# Patient Record
Sex: Female | Born: 1940 | Race: White | Hispanic: No | Marital: Married | Smoking: Never smoker
Health system: Southern US, Community
[De-identification: ages and names within clinical notes are randomized; demographics above are authoritative.]

## PROBLEM LIST (undated history)

## (undated) DIAGNOSIS — I10 Essential (primary) hypertension: Secondary | ICD-10-CM

## (undated) DIAGNOSIS — E785 Hyperlipidemia, unspecified: Secondary | ICD-10-CM

## (undated) DIAGNOSIS — M1990 Unspecified osteoarthritis, unspecified site: Secondary | ICD-10-CM

## (undated) DIAGNOSIS — E119 Type 2 diabetes mellitus without complications: Secondary | ICD-10-CM

## (undated) HISTORY — DX: Essential (primary) hypertension: I10

## (undated) HISTORY — DX: Hyperlipidemia, unspecified: E78.5

## (undated) HISTORY — DX: Unspecified osteoarthritis, unspecified site: M19.90

## (undated) HISTORY — DX: Type 2 diabetes mellitus without complications: E11.9

## (undated) NOTE — Telephone Encounter (Signed)
 Formatting of this note might be different from the original. URGENT referral received from [facility]  Placed in stat orders bin by break room  Electronically signed by Andra Lapine A at 07/11/2024 10:49 AM CDT

---

## 1998-04-07 ENCOUNTER — Ambulatory Visit: Admission: RE | Admit: 1998-04-07 | Discharge: 1998-04-07 | Payer: Self-pay | Admitting: General Surgery

## 1999-09-07 ENCOUNTER — Ambulatory Visit (HOSPITAL_COMMUNITY): Admission: RE | Admit: 1999-09-07 | Discharge: 1999-09-07 | Payer: Self-pay | Admitting: Interventional Cardiology

## 1999-09-07 ENCOUNTER — Encounter: Payer: Self-pay | Admitting: Interventional Cardiology

## 1999-09-11 ENCOUNTER — Encounter: Payer: Self-pay | Admitting: Internal Medicine

## 1999-09-11 ENCOUNTER — Ambulatory Visit (HOSPITAL_COMMUNITY): Admission: RE | Admit: 1999-09-11 | Discharge: 1999-09-11 | Payer: Self-pay | Admitting: Internal Medicine

## 2000-03-11 ENCOUNTER — Encounter: Admission: RE | Admit: 2000-03-11 | Discharge: 2000-03-11 | Payer: Self-pay | Admitting: General Surgery

## 2000-03-11 ENCOUNTER — Encounter: Payer: Self-pay | Admitting: General Surgery

## 2001-03-15 ENCOUNTER — Encounter: Payer: Self-pay | Admitting: General Surgery

## 2001-03-15 ENCOUNTER — Encounter: Admission: RE | Admit: 2001-03-15 | Discharge: 2001-03-15 | Payer: Self-pay | Admitting: General Surgery

## 2002-02-27 ENCOUNTER — Encounter: Payer: Self-pay | Admitting: General Surgery

## 2002-02-27 ENCOUNTER — Encounter: Admission: RE | Admit: 2002-02-27 | Discharge: 2002-02-27 | Payer: Self-pay | Admitting: General Surgery

## 2003-03-04 ENCOUNTER — Encounter: Payer: Self-pay | Admitting: General Surgery

## 2003-03-04 ENCOUNTER — Encounter: Admission: RE | Admit: 2003-03-04 | Discharge: 2003-03-04 | Payer: Self-pay | Admitting: General Surgery

## 2003-03-18 ENCOUNTER — Encounter (INDEPENDENT_AMBULATORY_CARE_PROVIDER_SITE_OTHER): Payer: Self-pay | Admitting: Specialist

## 2003-03-18 ENCOUNTER — Ambulatory Visit (HOSPITAL_BASED_OUTPATIENT_CLINIC_OR_DEPARTMENT_OTHER): Admission: RE | Admit: 2003-03-18 | Discharge: 2003-03-18 | Payer: Self-pay | Admitting: Plastic Surgery

## 2004-03-04 ENCOUNTER — Encounter: Admission: RE | Admit: 2004-03-04 | Discharge: 2004-03-04 | Payer: Self-pay | Admitting: General Surgery

## 2005-03-11 ENCOUNTER — Encounter: Admission: RE | Admit: 2005-03-11 | Discharge: 2005-03-11 | Payer: Self-pay | Admitting: General Surgery

## 2005-12-01 ENCOUNTER — Ambulatory Visit (HOSPITAL_COMMUNITY): Admission: RE | Admit: 2005-12-01 | Discharge: 2005-12-01 | Payer: Self-pay | Admitting: Gastroenterology

## 2006-03-14 ENCOUNTER — Encounter: Admission: RE | Admit: 2006-03-14 | Discharge: 2006-03-14 | Payer: Self-pay | Admitting: General Surgery

## 2006-05-02 ENCOUNTER — Ambulatory Visit: Payer: Self-pay | Admitting: Internal Medicine

## 2006-10-10 ENCOUNTER — Ambulatory Visit: Payer: Self-pay | Admitting: Internal Medicine

## 2006-10-18 ENCOUNTER — Ambulatory Visit: Payer: Self-pay | Admitting: Internal Medicine

## 2006-11-09 ENCOUNTER — Ambulatory Visit: Payer: Self-pay | Admitting: Internal Medicine

## 2007-03-27 ENCOUNTER — Encounter: Admission: RE | Admit: 2007-03-27 | Discharge: 2007-03-27 | Payer: Self-pay | Admitting: Family Medicine

## 2007-11-28 ENCOUNTER — Encounter: Admission: RE | Admit: 2007-11-28 | Discharge: 2007-11-28 | Payer: Self-pay | Admitting: Family Medicine

## 2008-03-27 ENCOUNTER — Encounter: Admission: RE | Admit: 2008-03-27 | Discharge: 2008-03-27 | Payer: Self-pay | Admitting: Family Medicine

## 2008-04-03 DIAGNOSIS — J309 Allergic rhinitis, unspecified: Secondary | ICD-10-CM | POA: Insufficient documentation

## 2008-04-03 DIAGNOSIS — J45909 Unspecified asthma, uncomplicated: Secondary | ICD-10-CM | POA: Insufficient documentation

## 2008-04-04 ENCOUNTER — Ambulatory Visit: Payer: Self-pay | Admitting: Internal Medicine

## 2008-04-04 LAB — CONVERTED CEMR LAB
Basophils Relative: 0.4 % (ref 0.0–1.0)
Eosinophils Relative: 3.4 % (ref 0.0–5.0)
Lymphocytes Relative: 48.6 % — ABNORMAL HIGH (ref 12.0–46.0)
MCHC: 33.6 g/dL (ref 30.0–36.0)
MCV: 91.5 fL (ref 78.0–100.0)
Neutro Abs: 2.5 10*3/uL (ref 1.4–7.7)
Platelets: 214 10*3/uL (ref 150–400)
RDW: 12.6 % (ref 11.5–14.6)

## 2008-09-30 ENCOUNTER — Ambulatory Visit: Payer: Self-pay | Admitting: Internal Medicine

## 2008-11-29 ENCOUNTER — Other Ambulatory Visit: Admission: RE | Admit: 2008-11-29 | Discharge: 2008-11-29 | Payer: Self-pay | Admitting: Family Medicine

## 2009-05-06 ENCOUNTER — Encounter: Admission: RE | Admit: 2009-05-06 | Discharge: 2009-05-06 | Payer: Self-pay | Admitting: Family Medicine

## 2010-04-09 ENCOUNTER — Other Ambulatory Visit: Admission: RE | Admit: 2010-04-09 | Discharge: 2010-04-09 | Payer: Self-pay | Admitting: Family Medicine

## 2010-05-13 ENCOUNTER — Encounter: Admission: RE | Admit: 2010-05-13 | Discharge: 2010-05-13 | Payer: Self-pay | Admitting: Family Medicine

## 2010-12-18 ENCOUNTER — Ambulatory Visit: Payer: Self-pay | Admitting: Internal Medicine

## 2010-12-18 DIAGNOSIS — J328 Other chronic sinusitis: Secondary | ICD-10-CM

## 2011-01-21 NOTE — Assessment & Plan Note (Signed)
Summary: ROV/ MBW   Primary Provider/Referring Provider:  Laurann Montana  CC:  follow up visit-asthma and allergies; cough and sneezing-still having green drainage.Marland Kitchen  History of Present Illness: ASTHMA (ICD-493.90) ALLERGIC RHINITIS (ICD-477.9)  70 yoF for f/u asthma, rhinitis. 68/16/09- She returns for follow-up of her asthma and allergic rhinitis.  Singulair did not help.  Patanol eye drops do help.  She credits improvement to the use of an alternative medicine, Spirolina, which is an algae..  She has had some cough intermittently bringing up green or white sputum.  Cough has been worse in the past month.  Her eyes itching.  She is sneezing.  Ears itch some.  She denies reflux.  09/30/08- Did well on trip to [place], air quality ok. Returned 5 days ago. Now ears stopping up. Says asthma rescue inhalers never helped her. Had flu vax.  January 12, 2011- Asthma, allergic rhinitis Nurse-CC: follow up visit-asthma and allergies; cough and sneezing-still having green drainage. Travelling in [place] - got sinusitis. Saw Dr Annalee Genta who gave her 10 days and then 7 days of Levaquin in November. Still a little green from left maxillary. She has been using saline rinse. Tried a systemic "cleansing " diet. Nose runs much of the time. Coughs up thick mucus, clear. Left frontal pressure. No more fever, sweat, and no wheeze.        Asthma History    Initial Asthma Severity Rating:    Age range: 12+ years    Symptoms: 0-2 days/week    Nighttime Awakenings: 0-2/month    Interferes w/ normal activity: no limitations    SABA use (not for EIB): 0-2 days/week    Asthma Severity Assessment: Intermittent   Preventive Screening-Counseling & Management  Alcohol-Tobacco     Smoking Status: never  Current Medications (verified): 1)  Zyrtec Allergy 10 Mg  Tabs (Cetirizine Hcl) .... Take 1 Tablet By Mouth Once A Day 2)  Crestor 10 Mg  Tabs (Rosuvastatin Calcium) .... Take 1 Tablet By Mouth Once A Day 3)   Premarin 0.625 Mg  Tabs (Estrogens Conjugated) .... Take 1 Tablet By Mouth Once A Day 4)  Patanol 0.1 %  Soln (Olopatadine Hcl) .Marland Kitchen.. 1 Drop Each Eye Two Times A Day As Needed  Allergies (verified): No Known Drug Allergies  Past History:  Past Medical History: Last updated: 04/03/2008 Allergic Rhinitis Asthma  Family History: Last updated: 01-12-2011 Father- died MI age 75 Mother- died pneumonia age 68  Social History: Last updated: January 12, 2011 Patient never smoked.  Worked [employer] as Scientist, clinical (histocompatibility and immunogenetics)- Retired Married Daughter is an Designer, industrial/product at [employer].   Risk Factors: Smoking Status: never (01-12-2011)  Family History: Father- died MI age 45 Mother- died pneumonia age 2  Social History: Patient never smoked.  Worked [employer] as Scientist, clinical (histocompatibility and immunogenetics)- Retired Married Daughter is an Designer, industrial/product at [employer].   Review of Systems      See HPI       The patient complains of productive cough, headaches, nasal congestion/difficulty breathing through nose, and change in color of mucus.  The patient denies shortness of breath with activity, shortness of breath at rest, coughing up blood, chest pain, irregular heartbeats, acid heartburn, indigestion, loss of appetite, weight change, abdominal pain, difficulty swallowing, sore throat, tooth/dental problems, sneezing, and fever.    Vital Signs:  Patient profile:   70 year old female Weight:      240 pounds O2 Sat:      98 % on Room air Pulse rate:   89 / minute  BP sitting:   132 / 82  (left arm) Cuff size:   large  Vitals Entered By: Reynaldo Minium CMA (December 18, 2010 9:03 AM)  O2 Flow:  Room air CC: follow up visit-asthma and allergies; cough and sneezing-still having green drainage.   Physical Exam  Additional Exam:  General: A/Ox3; pleasant and cooperative, NAD, SKIN: no rash, lesions NODES: no lymphadenopathy HEENT: [place]/AT, EOM- WNL, Conjuctivae- clear, PERRLA, TM- left TM retracted but no erythema, discharge or fluid  seen., Nose- yellow mucus in left nare, Throat- clear and wnl, . M III NECK: Supple w/ fair ROM, JVD- none, normal carotid impulses w/o bruits Thyroid- normal to palpation CHEST: Clear to P&A- maybe a little raspy left upper anterior chest HEART: RRR, no m/g/r heard ABDOMEN: overweight ZOX:WRUE, nl pulses, no edema  NEURO: Grossly intact to observation      Impression & Recommendations:  Problem # 1:  ALLERGIC RHINITIS (ICD-477.9)  Persistent rhinosinusitis. She is bothered by the watery nose and cough and general malaise. We discussed options and will give sample Astepro nasal spray, Rx augmentin. If she fails to improve after this, then she should have cxr and CT sinuses.  Her updated medication list for this problem includes:    Zyrtec Allergy 10 Mg Tabs (Cetirizine hcl) .Marland Kitchen... Take 1 tablet by mouth once a day  Problem # 2:  ASTHMA (ICD-493.90) Insignificant now. We discussed tendency for persistent upper respiratory inflammation to affect lower airway reactivity.  Medications Added to Medication List This Visit: 1)  Augmentin 875-125 Mg Tabs (Amoxicillin-pot clavulanate) .Marland Kitchen.. 1 two times a day  Other Orders: Est. Patient Level IV (45409)  Patient Instructions: 1)  Please schedule a follow-up appointment in 1 year. 2)  Call for early follow-up if you don't improve over the next week or two. 3)  Script augmentin 4)  sample Astepro nasal antihistamine  5)        1-2 puffs eachj nostril up to twice daily as needed 6)  continue saline rinse of your nsal passages 7)  consider trying Sudafed and/ or Mucinex.  Prescriptions: AUGMENTIN 875-125 MG TABS (AMOXICILLIN-POT CLAVULANATE) 1 two times a day  #14 x 1   Entered and Authorized by:   Waymon Budge MD   Signed by:   Waymon Budge MD on 12/18/2010   Method used:   Print then Give to Patient   RxID:   [id number]

## 2011-04-06 ENCOUNTER — Other Ambulatory Visit: Payer: Self-pay | Admitting: Family Medicine

## 2011-04-06 DIAGNOSIS — Z1231 Encounter for screening mammogram for malignant neoplasm of breast: Secondary | ICD-10-CM

## 2011-05-07 NOTE — Assessment & Plan Note (Signed)
[facility]                               PULMONARY OFFICE NOTE   COLBIE, SLIKER                   MRN:          [id number]  DATE:11/09/2006                            DOB:          02-07-1941    PROBLEMS:  1. Allergic rhinitis.  2. Asthma.   HISTORY:  After a difficult fall earlier, she says she is finally well,  except that she still tires a little easily on stairs.  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   MEDICATIONS:  Zyrtec, Premarin, glucosamine, Crestor, Mucinex DM, Endal HD.   ALLERGIES:  No medication allergy.   OBJECTIVE:  Weight 250 pounds, pulse regular 74, room air saturation 98%.  There is scarring of the left tympanic membrane without obvious fluid or  inflammation in the canal.  The right side is clear.  Nasal pharynx is  clear.  LUNGS:  Clear to P&A.  Heart sounds regular without murmur.  She looks comfortable, there is no adenopathy or stridor, no postnasal drip.   IMPRESSION:  1. Left tympanic membrane scarring from a previous otitis.  2. Allergic rhinitis and asthma/asthmatic bronchitis, currently stable. We      will want to look again at maintenance therapies for her.  She wishes      to return in April, based on her general seasonal pattern.  I want to      look at her allergy control status.     Clinton D. Maple Hudson, MD, [facility], FACP  Electronically Signed    CDY/MedQ  DD: 11/09/2006  DT: 11/09/2006  Job #: [id number]   cc:   Tally Joe, M.D.

## 2011-05-07 NOTE — Op Note (Signed)
NAME:  Janice Mcdonald, Janice Mcdonald                      ACCOUNT NO.:  [id number]   MEDICAL RECORD NO.:  [id number]                   PATIENT TYPE:  AMB   LOCATION:  DSC                                  FACILITY:  [facility]   PHYSICIAN:  Etter Sjogren, M.D.                  DATE OF BIRTH:  1941-03-18   DATE OF PROCEDURE:  03/18/2003  DATE OF DISCHARGE:                                 OPERATIVE REPORT   PREOPERATIVE DIAGNOSES:  1. Soft tissue mass, forehead, recurrent, undetermined behavior, greater     than 1.0 cm.  2. Lesion of left cheek, undetermined behavior, greater than 0.5 cm.  3. Lesion x2 of the nose, one nasal sidewall, one on the ala, less than 0.5     cm, undetermined behavior.   POSTOPERATIVE DIAGNOSES:  1. Soft tissue mass, forehead, recurrent, undetermined behavior, greater     than 1.0 cm.  2. Lesion of left cheek, undetermined behavior, greater than 0.5 cm.  3. Lesion x2 of the nose, one nasal sidewall, one on the ala, less than 0.5     cm, undetermined behavior.  4. Complicated open wound of the forehead, 2.0 cm, through all layers.   PROCEDURES:  1. Complex wound closure of forehead, greater than 2.0 cm.  2. Excision of lesion of undetermined behavior, left cheek, greater than 0.5     cm.  3. Excision of lesion, right nasal sidewall, less than 0.5 cm.  4. Excision of lesion, right ala of the nose, less than 0.5 cm.  5. Excision of soft tissue mass of undetermined behavior, submuscular, of     the forehead, greater than 1.0 cm.   SURGEON:  Etter Sjogren, M.D.   ANESTHESIA:  1% Xylocaine with epinephrine plus bicarbonate.   CLINICAL NOTE:  A 69 year old woman who has a number of lesions.  One of  them on the forehead is a soft tissue mass that had recurred, and it is  medically necessary to excise it.  This is a deep tumor extending below the  muscle.  She also has several lesions, left cheek and two on the nose, that  have enlarged and are elevated and warrant excision  as well.  The nature of  the procedure and the risks and the possibility of scarring and possible  need for surgery depending upon the final pathology report were discussed  with her, and she wished to proceed.   DESCRIPTION OF PROCEDURE:  The patient was placed supine.  She was prepped  with Betadine after marking the elliptical excisions.  She was draped with  sterile drapes.  Satisfactory local anesthesia was achieved.  The forehead  was addressed first, excising this lesion with an ellipse of overlying skin,  then carrying the dissection down deep all the way down to the periosteum.  The lesion was excised in its entirety.  The wound cleansed thoroughly.  There was no deep connection to the  underlying bone.  Thorough irrigation  and hemostasis having been confirmed, a layered closure of 5-0 Monocryl  interrupted, inverted deep sutures for the muscle, 5-0 Monocryl interrupted,  inverted subcutaneous, and 6-0 Prolene simple running suture.   Lesions of left cheek, right nasal sidewall, and right ala were excised with  elliptical excisions, irrigated thoroughly, and closed.  The left cheek  wound did require one deep Monocryl suture, inverted, and then the remainder  were 6-0  Prolene simple running sutures and interrupted sutures as needed.  Antibiotic ointment and sterile dressings applied.  Tolerated it well.   DISPOSITION:  Will see her back in the office in a week for recheck.                                               Etter Sjogren, M.D.    DB/MEDQ  D:  03/18/2003  T:  03/18/2003  Job:  [id number]

## 2011-05-07 NOTE — Op Note (Signed)
NAMECHIYEKO, FERRE NO.:  [id number]   MEDICAL RECORD NO.:  [id number]          PATIENT TYPE:  AMB   LOCATION:  ENDO                         FACILITY:  [facility]   PHYSICIAN:  Bernette Redbird, M.D.   DATE OF BIRTH:  1941-05-05   DATE OF PROCEDURE:  12/01/2005  DATE OF DISCHARGE:                                 OPERATIVE REPORT   PROCEDURE:  Colonoscopy.   ENDOSCOPIST:  Bernette Redbird, M.D.   INDICATIONS:  Initial colon cancer screening exam in an asymptomatic 70-year-  old female without worrisome risk factors.   FINDINGS:  Sigmoid diverticulosis and fixation but no polyps or cancer seen.   DESCRIPTION OF PROCEDURE:  The nature, purpose and risks of the procedure  had been briefly reviewed with the patient who had come to [place] through our  open access program and thus had already been introduced to the procedure  through the office.  She provided written consent prior to my discussion.   Sedation was fentanyl 100 mcg and Versed 5 mg IV without arrhythmias or  desaturation.   The Olympus adjustable tension pediatric video colonoscope was used for this  procedure and was advanced with moderate difficulty through a very fixated  and tightly angulated sigmoid region but then quite easily the remainder of  the way around the colon to the cecum, using some external abdominal  pressure to enter the base of the cecum which was identified by  visualization of appendiceal orifice.  Pullback was then performed.  The  quality of prep was excellent, and it was felt that all areas were well  seen.   In the sigmoid region was a moderate amount of diverticulosis but apart from  that in the associated fixation in that area, this was a normal examination.  No polyps, cancer, colitis or vascular ectasia were observed, and  retroflexion the rectum and reinspection of the rectum were normal.  No  biopsies were obtained.  The patient tolerated the procedure well, and there  no  apparent complications.   IMPRESSION:  :  Screening colonoscopy in a standard risk individual, without worrisome  findings (V7 6.51)  1.  Sigmoid diverticulosis and fixation.   PLAN:  Followup colon exam in 10 years, either colonoscopy or consideration  for virtual colonoscopy considering the technical tissues in this patient.           ______________________________  Bernette Redbird, M.D.     RB/MEDQ  D:  12/01/2005  T:  12/02/2005  Job:  [id number]   cc:   Tally Joe, M.D.  Fax: [phone]

## 2011-05-07 NOTE — Assessment & Plan Note (Signed)
 [facility]                               PULMONARY OFFICE NOTE   KAIDENCE, SANT                   MRN:          [id number]  DATE:10/18/2006                            DOB:          April 15, 1941    PROBLEM:  1. Allergic rhinitis.  2. Asthma.   HISTORY:  Increased head and chest congestion with cough and low-grade fever  began sometime in September.  She took a Z-Pack.  It got worse.  She saw the  nurse practitioner in October, was given [facility] and seemed to get better  for a while.  She has left sinus congestion now, and her ears are more open.  The fever began again 3 day ago, and she is coughing green sputum.  She does  not describe hard chills or sweats, more of a general sense of malaise.   MEDICATIONS:  1. Zyrtec.  2. Premarin.  3. Glucosamine.  4. Vitamins.  5. Crestor.  6. Mucinex DM.  7. Endal HD.   ALLERGIES:  No medication allergy.   OBJECTIVE:  VITAL SIGNS:  Weight 250 pounds.  BP 132/76, pulse regular 78,  room air saturation 98%.  There is some bubbles behind the left tympanic  membrane.  She is hoarse.  Speech quality is nasal.  There is a light  congestive cough.  Heart sounds are normal.   IMPRESSION:  1. Left serous otitis.  2. Rhinosinusitis.  3. Acute bronchitis.   PLAN:  1. Nasal nebulizer treatment with Neo-Synephrine.  2. Depo-Medrol 80 mg IM with steroid talk .  3. Avelox 400 mg daily for 10 days.  4. Fluids.  5. Keep scheduled appointment, earlier p.r.n. and start treatments.     Clinton D. Maple Hudson, MD, [facility], FACP  Electronically Signed    CDY/MedQ  DD: 10/20/2006  DT: 10/21/2006  Job #: [id number]   cc:   Tally Joe, M.D.

## 2011-05-07 NOTE — Assessment & Plan Note (Signed)
[facility]                               PULMONARY OFFICE NOTE   ANMARIE, FUKUSHIMA                   MRN:          [id number]  DATE:10/10/2006                            DOB:          [birth year]    HISTORY OF PRESENT ILLNESS:  The patient is a 70 year old white female  patient of Dr. Roxy Cedar who has a known history of allergic rhinitis and  asthma who presents with a 1-week history of increased cough, congestion,  fever, chills, sweats and sinus pain and pressure.  The patient denies any  hemoptysis, chest pain, orthopnea, PND or leg swelling.   PAST MEDICAL HISTORY:  Reviewed.   CURRENT MEDICATIONS:  Reviewed.   PHYSICAL EXAMINATION:  GENERAL:  The patient is a pleasant female in no  acute distress.  VITAL SIGNS:  She is afebrile with stable vital signs.  O2 saturation is 98%  on room air.  HEENT:  Nasal mucosa is with some mild erythema.  __________.  Left TM and  EAC are clear.  Right TM is erythematous and bulging.  NECK:  Supple without adenopathy.  LUNGS:  Lung sounds are clear.  CARDIAC:  Regular rate.  ABDOMEN:  Soft and benign.  EXTREMITIES:  Warm without edema.   IMPRESSION AND PLAN:  Acute __________ infection and right otitis  medication.  The patient is to begin Omnicef x7 days, Mucinex DM twice  daily, Zyrtec 10 mg daily over the next 5 days.  May use Inderal HC as  needed for cough.  The patient is to return if no improvement or symptoms  worsen.      ______________________________  Rubye Oaks, NP    ______________________________  Rennis Chris. Maple Hudson, MD, [facility], FACP     TP/MedQ  DD:  10/11/2006  DT:  10/12/2006  Job #:  [id number]

## 2011-05-13 ENCOUNTER — Encounter: Payer: Self-pay | Admitting: Internal Medicine

## 2011-05-13 ENCOUNTER — Ambulatory Visit (INDEPENDENT_AMBULATORY_CARE_PROVIDER_SITE_OTHER): Payer: Medicare Other | Admitting: Internal Medicine

## 2011-05-13 VITALS — BP 124/74 | HR 72 | Ht 68.0 in | Wt 236.0 lb

## 2011-05-13 DIAGNOSIS — J45909 Unspecified asthma, uncomplicated: Secondary | ICD-10-CM

## 2011-05-13 MED ORDER — AMOXICILLIN-POT CLAVULANATE 875-125 MG PO TABS: 1.0000 | ORAL_TABLET | Freq: Two times a day (BID) | ORAL | Status: DC

## 2011-05-13 MED ORDER — HYDROCODONE-HOMATROPINE 5-1.5 MG/5ML PO SYRP: 5.0000 mL | ORAL_SOLUTION | Freq: Four times a day (QID) | ORAL | Status: AC | PRN

## 2011-05-13 NOTE — Progress Notes (Signed)
  Subjective:    Patient ID: Janice Mcdonald, female    DOB: 1941-07-01, 70 y.o.   MRN: [id number]  HPI 05/13/11- 24 yo never smoker followed for asthma, allergic rhinitis, chronic rhinosinusitis. Last here December 18, 2010- treated with levaquin then augmentin for sinusitis then and finally cleared.  No starting with a cold 5 weeks ago she has had persistent cough with green. No relief from mucinex, robitussin. Denies sustained fever or syspnea. Cough till got vitreous tear and back hurts. No wheeze.  Review of Systems Constitutional:   No weight loss, night sweats,  Fevers, chills, fatigue, lassitude. HEENT:   No headaches,  Difficulty swallowing,  Tooth/dental problems,  Sore throat,                No sneezing, itching, ear ache, nasal congestion, post nasal drip,   CV:  No chest pain,  Orthopnea, PND, swelling in lower extremities, anasarca, dizziness, palpitations  GI  No heartburn, indigestion, abdominal pain, nausea, vomiting, diarrhea, change in bowel habits, loss of appetite  Resp: No shortness of breath with exertion or at rest. ,  No coughing up of blood.  .  No wheezing.   Skin: no rash or lesions.  GU: no dysuria, change in color of urine, no urgency or frequency.  No flank pain.  MS:  No joint pain or swelling.  No decreased range of motion.  No back pain.  Psych:  No change in mood or affect. No depression or anxiety.  No memory loss.      Objective:   Physical Exam General- Alert, Oriented, Affect-appropriate, Distress- none acute  Skin- rash-none, lesions- none, excoriation- none  Lymphadenopathy- none  Head- atraumatic  Eyes- Gross vision intact, PERRLA, conjunctivae clear secretions  Ears- Hearing, canals, Tm- normal  Nose- Clear, No-  Septal dev, mucus, polyps, erosion, perforation   Throat- Mallampati II , mucosa clear , drainage- none, tonsils- atrophic  Neck- flexible , trachea midline, no stridor , thyroid nl, carotid no bruit  Chest -  symmetrical excursion , unlabored     Heart/CV- RRR , no murmur , no gallop  , no rub, nl s1 s2                     - JVD- none , edema- none, stasis changes- none, varices- none     Lung- clear to P&A, wheeze- none, cough- none , dullness-none, rub- none     No rhonchi and no cough with deep breath     Chest wall-   Abd- tender-no, distended-no, bowel sounds-present, HSM- no  Br/ Gen/ Rectal- Not done, not indicated  Extrem- cyanosis- none, clubbing, none, atrophy- none, strength- nl  Neuro- grossly intact to observation        Assessment & Plan:

## 2011-05-13 NOTE — Patient Instructions (Signed)
Scripts for antibiotic and cough syrup  Fluids and cough drops are fine as needed

## 2011-05-13 NOTE — Assessment & Plan Note (Signed)
Asthmatic bronchitis, with symptoms more dramatic than exam findings at this visit. We considered neb, but will see if sustained antibiotic and cough syrup will be sufficient.

## 2011-05-18 ENCOUNTER — Ambulatory Visit
Admission: RE | Admit: 2011-05-18 | Discharge: 2011-05-18 | Disposition: A | Discharge status: Home / Self Care | Payer: Medicare Other | Source: Ambulatory Visit | Attending: Family Medicine | Admitting: Family Medicine

## 2011-05-18 DIAGNOSIS — Z1231 Encounter for screening mammogram for malignant neoplasm of breast: Secondary | ICD-10-CM

## 2011-11-23 ENCOUNTER — Encounter: Payer: Self-pay | Admitting: Internal Medicine

## 2011-11-23 ENCOUNTER — Ambulatory Visit (INDEPENDENT_AMBULATORY_CARE_PROVIDER_SITE_OTHER): Payer: Medicare Other | Admitting: Internal Medicine

## 2011-11-23 VITALS — BP 126/88 | HR 90 | Ht 68.0 in | Wt 245.0 lb

## 2011-11-23 DIAGNOSIS — J45909 Unspecified asthma, uncomplicated: Secondary | ICD-10-CM

## 2011-11-23 DIAGNOSIS — J309 Allergic rhinitis, unspecified: Secondary | ICD-10-CM

## 2011-11-23 NOTE — Progress Notes (Signed)
Patient ID: Janice Mcdonald, female    DOB: 1941-03-30, 70 y.o.   MRN: [id number]  HPI 05/13/11- 70 yo never smoker followed for asthma, allergic rhinitis, chronic rhinosinusitis. Last here December 18, 2010- treated with levaquin then augmentin for sinusitis then and finally cleared.  No starting with a cold 5 weeks ago she has had persistent cough with green. No relief from mucinex, robitussin. Denies sustained fever or syspnea. Cough till got vitreous tear and back hurts. No wheeze.  11/23/11- 72 yo never smoker followed for asthma, allergic rhinitis, chronic rhinosinusitis. Had flu vaccine. 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 Every day she has a watery rhinorrhea with mild cough triggered by odors and smoke. She feels she is doing very much better since she began taking an herbal supplement Spirulina.  Review of Systems Constitutional:   No-   weight loss, night sweats, fevers, chills, fatigue, lassitude. HEENT:   No-  headaches, difficulty swallowing, tooth/dental problems, sore throat,       No-  sneezing, itching, ear ache, nasal congestion, +post nasal drip,  CV:  No-   chest pain, orthopnea, PND, swelling in lower extremities, anasarca,                                  dizziness, palpitations Resp: No-   shortness of breath with exertion or at rest.              No-   productive cough,  Minor non-productive cough,  No- coughing up of blood.              No-   change in color of mucus.  No- wheezing.   Skin: No-   rash or lesions. GI:  No-   heartburn, indigestion, abdominal pain, nausea, vomiting, diarrhea,                 change in bowel habits, loss of appetite GU: No-   dysuria, change in color of urine, no urgency or frequency.  No- flank pain. MS:  No-   joint pain or swelling.  No- decreased range of motion.  No- back pain. Neuro-     nothing unusual Psych:  No- change in mood or affect. No depression or anxiety.  No memory loss.         Objective:   Physical Exam General- Alert, Oriented, Affect-appropriate, Distress- none acute, overweight Skin- rash-none, lesions- none, excoriation- none Lymphadenopathy- none Head- atraumatic            Eyes- Gross vision intact, PERRLA, conjunctivae clear secretions            Ears- Hearing, canals-normal            Nose- Clear, no-Septal dev, mucus, polyps, erosion, perforation             Throat- Mallampati III, mucosa clear , drainage- none, tonsils- atrophic Neck- flexible , trachea midline, no stridor , thyroid nl, carotid no bruit Chest - symmetrical excursion , unlabored           Heart/CV- RRR , no murmur , no gallop  , no rub, nl s1 s2                           - JVD- none , edema- none, stasis changes- none, varices- none  Lung- clear to P&A, wheeze- none, cough- none , dullness-none, rub- none           Chest wall-  Abd- tender-no, distended-no, bowel sounds-present, HSM- no Br/ Gen/ Rectal- Not done, not indicated Extrem- cyanosis- none, clubbing, none, atrophy- none, strength- nl Neuro- grossly intact to observation

## 2011-11-23 NOTE — Patient Instructions (Signed)
I am glad you are doing well. Please call as needed.

## 2011-11-26 NOTE — Assessment & Plan Note (Signed)
Insignificant symptoms over the last year.

## 2011-11-26 NOTE — Assessment & Plan Note (Signed)
She is satisfied with her control over time. She is particularly pleased by what she thinks is response to her herbal supplements. I am suggesting that she come back as needed.

## 2012-04-14 ENCOUNTER — Other Ambulatory Visit: Payer: Self-pay | Admitting: Family Medicine

## 2012-04-14 DIAGNOSIS — Z1231 Encounter for screening mammogram for malignant neoplasm of breast: Secondary | ICD-10-CM

## 2012-05-29 ENCOUNTER — Ambulatory Visit
Admission: RE | Admit: 2012-05-29 | Discharge: 2012-05-29 | Disposition: A | Discharge status: Home / Self Care | Payer: Medicare Other | Source: Ambulatory Visit | Attending: Family Medicine | Admitting: Family Medicine

## 2012-05-29 DIAGNOSIS — Z1231 Encounter for screening mammogram for malignant neoplasm of breast: Secondary | ICD-10-CM

## 2012-11-01 ENCOUNTER — Telehealth: Payer: Self-pay | Admitting: Internal Medicine

## 2012-11-01 NOTE — Telephone Encounter (Signed)
ATC, line busy, WCB.  

## 2012-11-02 MED ORDER — AMOXICILLIN-POT CLAVULANATE 875-125 MG PO TABS: 1.0000 | ORAL_TABLET | Freq: Two times a day (BID) | ORAL | Status: DC

## 2012-11-02 NOTE — Telephone Encounter (Signed)
Per CY-lets give patient Augmentin 875 mg #20 take 1 po bid no refills and lets see if this helps; if not please call for appt.

## 2012-11-02 NOTE — Telephone Encounter (Signed)
Spoke with pt She is c/o sinus pressure/HA, prod cough with moderate green sputum--onset beginning of Sept 2013 Taking mucinex and "diabetic cough syrup" and these help some Would like something called in Last ov with CDY 11/23/11 No ov pending No Known Allergies

## 2013-06-04 ENCOUNTER — Other Ambulatory Visit: Payer: Self-pay

## 2013-06-04 DIAGNOSIS — Z1231 Encounter for screening mammogram for malignant neoplasm of breast: Secondary | ICD-10-CM

## 2013-10-22 ENCOUNTER — Ambulatory Visit: Payer: Medicare Other

## 2013-10-23 ENCOUNTER — Ambulatory Visit: Payer: Medicare Other

## 2013-11-09 ENCOUNTER — Ambulatory Visit
Admission: RE | Admit: 2013-11-09 | Discharge: 2013-11-09 | Disposition: A | Discharge status: Home / Self Care | Payer: Medicare Other | Source: Ambulatory Visit

## 2013-11-09 DIAGNOSIS — Z1231 Encounter for screening mammogram for malignant neoplasm of breast: Secondary | ICD-10-CM

## 2014-10-08 ENCOUNTER — Other Ambulatory Visit: Payer: Self-pay

## 2014-10-08 DIAGNOSIS — Z1231 Encounter for screening mammogram for malignant neoplasm of breast: Secondary | ICD-10-CM

## 2014-12-02 ENCOUNTER — Ambulatory Visit
Admission: RE | Admit: 2014-12-02 | Discharge: 2014-12-02 | Disposition: A | Discharge status: Home / Self Care | Payer: Medicare Other | Source: Ambulatory Visit

## 2014-12-02 DIAGNOSIS — Z1231 Encounter for screening mammogram for malignant neoplasm of breast: Secondary | ICD-10-CM

## 2015-11-05 ENCOUNTER — Other Ambulatory Visit: Payer: Self-pay

## 2015-11-05 DIAGNOSIS — Z1231 Encounter for screening mammogram for malignant neoplasm of breast: Secondary | ICD-10-CM

## 2015-12-19 ENCOUNTER — Ambulatory Visit
Admission: RE | Admit: 2015-12-19 | Discharge: 2015-12-19 | Disposition: A | Discharge status: Home / Self Care | Payer: Medicare Other | Source: Ambulatory Visit

## 2015-12-19 DIAGNOSIS — Z1231 Encounter for screening mammogram for malignant neoplasm of breast: Secondary | ICD-10-CM

## 2015-12-26 ENCOUNTER — Encounter: Payer: Self-pay | Admitting: Cardiology

## 2016-06-02 ENCOUNTER — Encounter: Payer: Self-pay | Admitting: Cardiology

## 2016-06-02 DIAGNOSIS — I25118 Atherosclerotic heart disease of native coronary artery with other forms of angina pectoris: Secondary | ICD-10-CM | POA: Insufficient documentation

## 2016-06-02 DIAGNOSIS — E7800 Pure hypercholesterolemia, unspecified: Secondary | ICD-10-CM | POA: Insufficient documentation

## 2016-06-03 ENCOUNTER — Encounter: Payer: Self-pay | Admitting: Cardiology

## 2016-06-07 ENCOUNTER — Encounter: Payer: Self-pay | Admitting: Cardiology

## 2016-06-08 ENCOUNTER — Encounter: Payer: Self-pay | Admitting: Cardiology

## 2016-11-04 ENCOUNTER — Encounter: Payer: Self-pay | Admitting: *Deleted

## 2016-11-05 ENCOUNTER — Encounter: Payer: Self-pay | Admitting: *Deleted

## 2016-11-09 ENCOUNTER — Other Ambulatory Visit: Payer: Self-pay | Admitting: Family Medicine

## 2016-11-09 DIAGNOSIS — Z1231 Encounter for screening mammogram for malignant neoplasm of breast: Secondary | ICD-10-CM

## 2016-11-10 ENCOUNTER — Encounter: Payer: Self-pay | Admitting: Cardiology

## 2016-11-15 ENCOUNTER — Encounter: Payer: Self-pay | Admitting: Cardiology

## 2016-11-16 ENCOUNTER — Ambulatory Visit (INDEPENDENT_AMBULATORY_CARE_PROVIDER_SITE_OTHER): Payer: Medicare Other | Admitting: Cardiology

## 2016-11-16 ENCOUNTER — Encounter: Payer: Self-pay | Admitting: Cardiology

## 2016-11-16 ENCOUNTER — Encounter (INDEPENDENT_AMBULATORY_CARE_PROVIDER_SITE_OTHER): Payer: Self-pay

## 2016-11-16 VITALS — BP 146/90 | HR 74 | Ht 67.0 in | Wt 220.8 lb

## 2016-11-16 DIAGNOSIS — I2510 Atherosclerotic heart disease of native coronary artery without angina pectoris: Secondary | ICD-10-CM

## 2016-11-16 DIAGNOSIS — E7849 Other hyperlipidemia: Secondary | ICD-10-CM

## 2016-11-16 DIAGNOSIS — I2583 Coronary atherosclerosis due to lipid rich plaque: Secondary | ICD-10-CM

## 2016-11-16 DIAGNOSIS — E784 Other hyperlipidemia: Secondary | ICD-10-CM

## 2016-11-16 DIAGNOSIS — I10 Essential (primary) hypertension: Secondary | ICD-10-CM | POA: Diagnosis not present

## 2016-11-16 DIAGNOSIS — E6609 Other obesity due to excess calories: Secondary | ICD-10-CM

## 2016-11-16 MED ORDER — CARVEDILOL 12.5 MG PO TABS: 12.5000 mg | ORAL_TABLET | Freq: Two times a day (BID) | ORAL | 3 refills | Status: DC

## 2016-11-16 NOTE — Patient Instructions (Signed)
Medication Instructions:  Please increase your Carvedilol to 12.5 mg twice a day. Continue all other other medications as listed.  You have been referred to the Lipid Clinic.  Follow-Up: Follow up in 6 months with Dr. Anne FuSkains (around 1st week in May 2018).  You will receive a letter in the mail 2 months before you are due.  Please call us when you receive this letter to schedule your follow up appointment.  If you need a refill on your cardiac medications before your next appointment, please call your pharmacy.  Thank you for choosing [facility]!!

## 2016-11-16 NOTE — Progress Notes (Signed)
Cardiology Office Note    Date:  11/17/2016   ID:  Hennie, Gosa 1941/03/10, MRN [id number]  PCP:  Cala Bradford, MD  Cardiologist:   Donato Schultz, MD     History of Present Illness:  Janice Mcdonald is a 75 y.o. female here to establish care, coronary artery disease with stent placement in [name] summer of 2017. Dr. Adora Fridge did cath. Was visiting in May 2017. Thought it was GERD, Tums would help. EGD looked good. Noted with activity. Daughter. Dr. Hortense Ramal (Cards) - gave approval to go to [place]. Been to [place]. [employer]. CE credits.   Has history of diabetes, hyperlipidemia intolerant of Crestor, previously refusing other statin medications, hypertension.  Creatinine 0.8, glucose 1:30, ALT 23, TSH 0.8, total cholesterol 304 triglycerides 167, HDL 53, LDL 217, hemoglobin A1c 7.1  She is a nonsmoker. 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  In review of her prior cardiology notes from [place], she had a markedly abnormal nuclear stress test which resulted in cardiac catheterization and subsequent PCI. Her ejection fraction was 60% with hypokinesis of the anterolateral wall. She had 90% proximal narrowing of the LAD, 50% narrowing of the diagonal 2, 90% apical LAD stenosis, 70% narrowing of small obtuse marginal 2, 30% proximal RCA, 50% to 30% mid narrowing of the RCA, patent PDA with 50% narrowing of a large posterior lateral branch of the distal RCA. She had successful drug eluting stent placement to the proximal LAD with a 3.0 x 20 energy drug-eluting stent upsized distally to 3.5 x 12 noncompliant balloon and proximally with a 4.0 x 12 noncompliant balloon. Her LDL at repeat from her cardiology visit was 89. Excellent. IVF are to the RCA was not significant. Repatha.   EKG shows sinus rhythm, biphasic T-wave in V2 and  V3.    [address] [place], [place] upper [place].   Past Medical History:  Diagnosis Date  . ALLERGIC RHINITIS   . Asthma   . Diabetes mellitus without complication (HCC)   . Hyperlipidemia   . Hypertension   . Osteoarthritis     No past surgical history on file.  Current Medications: Outpatient Medications Prior to Visit  Medication Sig Dispense Refill  . amitriptyline (ELAVIL) 10 MG tablet     . aspirin 81 MG tablet Take 81 mg by mouth daily.      . calcium carbonate (OS-CAL) 600 MG TABS Take 600 mg by mouth daily.      . Cholecalciferol (VITAMIN D) 1000 UNITS capsule Take 2,000 Units by mouth daily.      . Coenzyme Q10 (COQ10) 100 MG CAPS Take 1 capsule by mouth daily.      . fluticasone (FLONASE) 50 MCG/ACT nasal spray As needed    . Multiple Vitamins-Minerals (MULTIVITAMIN PO) Take by mouth daily.    . nitroGLYCERIN (NITROSTAT) 0.4 MG SL tablet Place 0.4 mg under the tongue every 5 (five) minutes as needed for chest pain.    . ticagrelor (BRILINTA) 90 MG TABS tablet Take by mouth 2 (two) times daily.    . vitamin C (ASCORBIC ACID) 500 MG tablet Take 500 mg by mouth daily.      Marland Kitchen losartan (COZAAR) 50 MG tablet Take 1 tablet by mouth daily.    Marland Kitchen atorvastatin (LIPITOR) 10 MG tablet Take 10 mg by mouth daily.    Marland Kitchen  celecoxib (CELEBREX) 200 MG capsule Take 200 mg by mouth daily.      . ondansetron (ZOFRAN) 4 MG tablet Take 4 mg by mouth every 8 (eight) hours as needed for nausea or vomiting.     No facility-administered medications prior to visit.      Allergies:   Symmetrel [amantadine]; Crestor [rosuvastatin]; and Singulair [montelukast]   Social History   Social History  . Marital status: Married    Spouse name: N/A  . Number of children: N/A  . Years of education: N/A   Occupational History  . 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 Smoking status: Never Smoker  . Smokeless tobacco: Never Used  . Alcohol use 1.2 oz/week    2 Glasses of wine per week      Comment: DRINKS TWICE A  MONTH   . Drug use: No  . Sexual activity: Not Asked   Other Topics Concern  . None   Social History Narrative   Daughter is an Designer, industrial/productanesthesiologist at [employer]     Family History:  The patient's family history includes Heart attack in her father; Pneumonia in her mother.   ROS:   Please see the history of present illness.    ROS All other systems reviewed and are negative.   PHYSICAL EXAM:   VS:  BP (!) 146/90 (BP Location: Right Arm, Cuff Size: Normal)   Pulse 74   Ht 5\' 7"  (1.702 m)   Wt 220 lb 12.8 oz (100.2 kg)   LMP  (LMP Unknown)   BMI 34.58 kg/m    GEN: Well nourished, well developed, in no acute distress  HEENT: normal  Neck: no JVD, carotid bruits, or masses Cardiac: RRR; no murmurs, rubs, or gallops,no edema  Respiratory:  clear to auscultation bilaterally, normal work of breathing GI: soft, nontender, nondistended, + BS MS: no deformity or atrophy  Skin: warm and dry, no rash Neuro:  Alert and Oriented x 3, Strength and sensation are intact Psych: euthymic mood, full affect  Wt Readings from Last 3 Encounters:  11/16/16 220 lb 12.8 oz (100.2 kg)  11/23/11 245 lb (111.1 kg)  05/13/11 (!) 236 lb (107 kg)      Studies/Labs Reviewed:   EKG:  EKG is ordered today. 11/16/16-sinus rhythm, 74, no other abnormalities. T-wave inversions are no longer seen on EKG in V2 and V3.  Recent Labs: No results found for requested labs within last 8760 hours.   Lipid Panel No results found for: CHOL, TRIG, HDL, CHOLHDL, VLDL, LDLCALC, LDLDIRECT  Additional studies/ records that were reviewed today include:  Prior office notes reviewed, lab work reviewed, ECGs reviewed    ASSESSMENT:    1. Coronary artery disease due to lipid rich plaque   2. Familial hyperlipidemia   3. Class 1 obesity due to excess calories without serious comorbidity in adult, unspecified BMI   4. Essential hypertension      PLAN:  In order of problems listed  above:  Coronary artery disease  - Proximal LAD stent 06/07/16  - This was after abnormal nuclear stress test and exertional anginal symptoms.  - Aggressive secondary prevention. She is asymptomatic. Doing well. Enjoys travel.  - Continue dual antiplatelet therapy for one year  Familial hyperlipidemia  - She is currently taking PCSK9 Repatha. [name] giving [place] this medication  - She has been intolerant of statins.  - I will set her up with lipid clinic.  Obesity  - Encourage weight loss  Essential hypertension  - Goal blood pressure less than 130/80. At home she occasionally hits this target  - I will increase her carvedilol to 12.5 mg twice a day. She changed her carvedilol a week ago to 6.25.     Medication Adjustments/Labs and Tests Ordered: Current medicines are reviewed at length with the patient today.  Concerns regarding medicines are outlined above.  Medication changes, Labs and Tests ordered today are listed in the Patient Instructions below. Patient Instructions  Medication Instructions:  Please increase your Carvedilol to 12.5 mg twice a day. Continue all other other medications as listed.  You have been referred to the Lipid Clinic.  Follow-Up: Follow up in 6 months with Dr. Anne FuSkains (around 1st week in May 2018).  You will receive a letter in the mail 2 months before you are due.  Please call us when you receive this letter to schedule your follow up appointment.  If you need a refill on your cardiac medications before your next appointment, please call your pharmacy.  Thank you [facility] choosing [facility]!!        Signed, Donato SchultzMark Sharnelle Cappelli, MD  11/17/2016 5:06 PM    [facility] [address], [place], [place]  [address] Phone: [phone]) [phone]; Fax: [phone]) [phone]

## 2016-11-19 ENCOUNTER — Ambulatory Visit (INDEPENDENT_AMBULATORY_CARE_PROVIDER_SITE_OTHER): Payer: Medicare Other | Admitting: Pharmacist

## 2016-11-19 DIAGNOSIS — E785 Hyperlipidemia, unspecified: Secondary | ICD-10-CM | POA: Insufficient documentation

## 2016-11-19 MED ORDER — PRAVASTATIN SODIUM 20 MG PO TABS: 20.0000 mg | ORAL_TABLET | Freq: Every evening | ORAL | 11 refills | Status: DC

## 2016-11-19 NOTE — Patient Instructions (Signed)
Start taking pravastatin 20mg  once a day in the evening.  Hold your Repatha for the next 2 injections - we will plan to draw new baseline labs before submitting paperwork to your insurance company.  Call Leafy Motsinger in lipid clinic with tolerability #[phone].

## 2016-11-19 NOTE — Progress Notes (Signed)
Patient ID: Janice Mcdonald                 DOB: May 20, 1941                    MRN: [id number]     HPI: Janice DunKathleen M Freiermuth is a 75 y.o. female patient referred to lipid clinic by Dr Anne FuSkains. PMH is significant for CAD s/p PCI in 2017, angina, DM, HLD, and HTN. LVEF 60%, 90% proximal narrowing of the LAD, 50% narrowing of the diagonal 2, 90% apical LAD stenosis, 70% narrowing of small obtuse marginal 2. Pt has previously been intolerant to Crestor and refused other statin medications. She presents today for further lipid management.  Patient's previous cardiologist was giving her Repatha samples. They never tried to get her approved through her insurance. Advised her that we do not have enough samples to give pt and that we will need to get the medication approved through her insurance, which should have been done before giving patient samples. She has only tried Crestor and her LDL is currently 87 on Repatha. This will make approval difficult.  Current Medications: Repatha samples from previous cardiologist in [place] Intolerances: Crestor unknown dose - excruciating leg pain for 1.5 hours each evening Risk Factors: Proximal LAD stent in June 2017, angina, CAD with >50% stenosis in multiple vessels LDL goal: 70mg /dL  Diet: Uses a clean eating magazine to cook recipes. Soup with veggies, chicken stir fry etc. Eats a lot of veggies.  Exercise: Has a bad back which limits activity. Is joining a gym.   Family History: Father with MI.  Social History: Garment/textile technologisturse anesthetist for 40 years. Patient denies smoking, drinks wine occasionally, and denies illicit drug use.  Labs: 05/2016: TC 304, TG 167, HDL 53, LDL 217 (no therapy) LDL improved to 87 on Repatha - checked with PCP Laurann Montanaynthia White at [facility]  Past Medical History:  Diagnosis Date  . ALLERGIC RHINITIS   . Asthma   . Diabetes mellitus without complication (HCC)   . Hyperlipidemia   . Hypertension   . Osteoarthritis     Current  Outpatient Prescriptions on File Prior to Visit  Medication Sig Dispense Refill  . amitriptyline (ELAVIL) 10 MG tablet     . aspirin 81 MG tablet Take 81 mg by mouth daily.      . calcium carbonate (OS-CAL) 600 MG TABS Take 600 mg by mouth daily.      . carvedilol (COREG) 12.5 MG tablet Take 1 tablet (12.5 mg total) by mouth 2 (two) times daily. 180 tablet 3  . Cholecalciferol (VITAMIN D) 1000 UNITS capsule Take 2,000 Units by mouth daily.      . Coenzyme Q10 (COQ10) 100 MG CAPS Take 1 capsule by mouth daily.      . fluticasone (FLONASE) 50 MCG/ACT nasal spray As needed    . losartan (COZAAR) 100 MG tablet Take 1 tablet (100 mg total) by mouth daily.    . Multiple Vitamins-Minerals (MULTIVITAMIN PO) Take by mouth daily.    . nitroGLYCERIN (NITROSTAT) 0.4 MG SL tablet Place 0.4 mg under the tongue every 5 (five) minutes as needed for chest pain.    . ticagrelor (BRILINTA) 90 MG TABS tablet Take by mouth 2 (two) times daily.    . vitamin C (ASCORBIC ACID) 500 MG tablet Take 500 mg by mouth daily.       No current facility-administered medications on file prior to visit.     Allergies  Allergen  Reactions  . Symmetrel [Amantadine] Anxiety  . Crestor [Rosuvastatin] Other (See Comments)    LEG CRAMPS  . Singulair [Montelukast]     NOT EFFECTIVE    Assessment/Plan:  1. Hyperlipidemia - Patient has been on Repatha samples from her previous cardiologist in [place]. No one ever submitted paperwork for medication approval through her insurance company. This will make insurance approval difficult since her labwork now shows an LDL too low for approval, plus pt has only tried 1 statin which is not enough to get her approved. Pt is agreeable to trying pravastatin 20mg  daily. Advised her to skip her next 2 injections of Repatha. Will recheck labs in ~1 month for new baseline pending pt tolerability of pravastatin. Will submit paperwork for Repatha coverage at that time.   Megan E. Supple, PharmD,  CPP [facility] [address], [place], [place] [address] Phone: [phone](336) [phone]; Fax: [phone]) [phone] 11/19/2016 10:50 AM

## 2016-12-17 ENCOUNTER — Telehealth: Payer: Self-pay | Admitting: Pharmacist

## 2016-12-17 MED ORDER — EZETIMIBE 10 MG PO TABS: 10.0000 mg | ORAL_TABLET | Freq: Every day | ORAL | 11 refills | Status: DC

## 2016-12-17 MED ORDER — PRAVASTATIN SODIUM 40 MG PO TABS: 40.0000 mg | ORAL_TABLET | Freq: Every evening | ORAL | 11 refills | Status: DC

## 2016-12-17 NOTE — Telephone Encounter (Signed)
Pt called to report that she is tolerating pravastatin 20mg  well. She is agreeable to increasing dose to 40mg  daily. She is also agreeable to starting Zetia 10mg  2 weeks after increasing pravastatin dose. She will call clinic in 1 month with update. If tolerating, will recheck labs and submit for Repatha if LDL is still above goal.

## 2016-12-21 ENCOUNTER — Ambulatory Visit
Admission: RE | Admit: 2016-12-21 | Discharge: 2016-12-21 | Disposition: A | Discharge status: Home / Self Care | Payer: Medicare Other | Source: Ambulatory Visit | Attending: Family Medicine | Admitting: Family Medicine

## 2016-12-21 DIAGNOSIS — Z1231 Encounter for screening mammogram for malignant neoplasm of breast: Secondary | ICD-10-CM

## 2016-12-22 ENCOUNTER — Other Ambulatory Visit: Payer: Self-pay | Admitting: Family Medicine

## 2016-12-22 DIAGNOSIS — R928 Other abnormal and inconclusive findings on diagnostic imaging of breast: Secondary | ICD-10-CM

## 2016-12-29 ENCOUNTER — Ambulatory Visit
Admission: RE | Admit: 2016-12-29 | Discharge: 2016-12-29 | Disposition: A | Discharge status: Home / Self Care | Payer: Medicare Other | Source: Ambulatory Visit | Attending: Family Medicine | Admitting: Family Medicine

## 2016-12-29 DIAGNOSIS — R928 Other abnormal and inconclusive findings on diagnostic imaging of breast: Secondary | ICD-10-CM

## 2017-01-04 ENCOUNTER — Other Ambulatory Visit: Payer: Self-pay | Admitting: *Deleted

## 2017-01-04 MED ORDER — CARVEDILOL 12.5 MG PO TABS: 12.5000 mg | ORAL_TABLET | Freq: Two times a day (BID) | ORAL | 2 refills | Status: DC

## 2017-01-04 MED ORDER — TICAGRELOR 90 MG PO TABS: 90.0000 mg | ORAL_TABLET | Freq: Two times a day (BID) | ORAL | 1 refills | Status: DC

## 2017-01-04 NOTE — Telephone Encounter (Signed)
Patient called and requested a refill on brilinta but she states that Dr Anne FuSkains only wants her to take it until June.

## 2017-01-19 ENCOUNTER — Telehealth: Payer: Self-pay | Admitting: Pharmacist

## 2017-01-19 DIAGNOSIS — E785 Hyperlipidemia, unspecified: Secondary | ICD-10-CM

## 2017-01-19 NOTE — Telephone Encounter (Signed)
Pt called with tolerability update on pravastatin 40mg  daily and Zetia 10mg  daily. She is tolerating both medications well and has not had any adverse effects yet. She has been on both medications combined for 2 weeks so far. Discussed increasing pravastatin to 80mg  vs checking labs to see where pt's cholesterol is. She would like to continue current medications. Will recheck lipids in 1 month and reassess at that time.

## 2017-02-15 ENCOUNTER — Other Ambulatory Visit: Payer: Medicare Other | Admitting: *Deleted

## 2017-02-15 DIAGNOSIS — E785 Hyperlipidemia, unspecified: Secondary | ICD-10-CM

## 2017-02-15 LAB — LIPID PANEL
CHOL/HDL RATIO: 3.1 ratio (ref 0.0–4.4)
Cholesterol, Total: 157 mg/dL (ref 100–199)
HDL: 50 mg/dL (ref >39)
LDL Calculated: 75 mg/dL (ref 0–99)
TRIGLYCERIDES: 162 mg/dL — AB (ref 0–149)
VLDL Cholesterol Cal: 32 mg/dL (ref 5–40)

## 2017-02-16 ENCOUNTER — Telehealth: Payer: Self-pay | Admitting: Pharmacist

## 2017-02-16 DIAGNOSIS — E785 Hyperlipidemia, unspecified: Secondary | ICD-10-CM

## 2017-02-16 NOTE — Telephone Encounter (Signed)
Discussed lipid results with pt. LDL has improved drastically from baseline of 217 to 75 since starting Zetia 10mg  daily and titrating pravastatin up to 40mg  daily. This is actually lower than when she was only taking Repatha. She is tolerating meds well and denies myalgias. Pt has been going to the gym twice a week and has been eating well. Labs reflect pt on higher pravastatin dose for the past 1 month. Discussed increasing dose to 80mg  daily vs focusing on lifestyle to bring LDL < 70. Pt would like to continue current meds and work on increasing her activity level. Will recheck lipids in 3-4 months.

## 2017-04-19 NOTE — Progress Notes (Signed)
Cardiology Office Note    Date:  04/20/2017   ID:  Janice, Mcdonald 02-26-1941, MRN [id number]  PCP:  Cala Bradford, MD  Cardiologist:   Donato Schultz, MD     History of Present Illness:  Janice Mcdonald is a 76 y.o. female here to follow up, coronary artery disease with stent placement in [name] summer of 2017. Dr. Adora Fridge did cath. Was visiting in May 2017. Thought it was GERD, Tums would help. EGD looked good. Noted with activity. Daughter. Dr. Hortense Ramal (Cards) - gave approval to go to [place]. Been to [place]. [employer]. CE credits.   Has history of diabetes, hyperlipidemia intolerant of Crestor, previously refusing other statin medications, hypertension.  Creatinine 0.8, glucose 130, ALT 23, TSH 0.8, total cholesterol 304 triglycerides 167, HDL 53, LDL 217, hemoglobin A1c 7.1  She is a nonsmoker. 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  In review of her prior cardiology notes from [place], she had a markedly abnormal nuclear stress test which resulted in cardiac catheterization and subsequent PCI. Her ejection fraction was 60% with hypokinesis of the anterolateral wall. She had 90% proximal narrowing of the LAD, 50% narrowing of the diagonal 2, 90% apical LAD stenosis, 70% narrowing of small obtuse marginal 2, 30% proximal RCA, 50% to 30% mid narrowing of the RCA, patent PDA with 50% narrowing of a large posterior lateral branch of the distal RCA. She had successful drug eluting stent placement to the proximal LAD with a 3.0 x 20 energy drug-eluting stent upsized distally to 3.5 x 12 noncompliant balloon and proximally with a 4.0 x 12 noncompliant balloon. Her LDL at repeat from her cardiology visit was 89. Excellent. IVF are to the RCA was not significant. Repatha.   EKG shows sinus rhythm, biphasic T-wave in V2 and V3.  [name]   04/20/17  - worried about coming off Brilinta.     Past Medical History:  Diagnosis Date  . ALLERGIC RHINITIS   . Asthma   . Diabetes mellitus without complication (HCC)   . Hyperlipidemia   . Hypertension   . Osteoarthritis     No past surgical history on file.  Current Medications: Outpatient Medications Prior to Visit  Medication Sig Dispense Refill  . amitriptyline (ELAVIL) 10 MG tablet Take 10 mg by mouth at bedtime.     Marland Kitchen aspirin 81 MG tablet Take 81 mg by mouth daily.      . calcium carbonate (OS-CAL) 600 MG TABS Take 600 mg by mouth daily.      . carvedilol (COREG) 12.5 MG tablet Take 1 tablet (12.5 mg total) by mouth 2 (two) times daily. 180 tablet 2  . Cholecalciferol (VITAMIN D) 1000 UNITS capsule Take 2,000 Units by mouth daily.      . Coenzyme Q10 (COQ10) 100 MG CAPS Take 1 capsule by mouth daily.      . fluticasone (FLONASE) 50 MCG/ACT nasal spray Place 1 spray into both nostrils daily as needed for allergies. As needed    . losartan (COZAAR) 100 MG tablet Take 1 tablet (100 mg total) by mouth daily.    . Multiple Vitamins-Minerals (MULTIVITAMIN PO) Take 1 tablet by mouth daily.     . nitroGLYCERIN (NITROSTAT) 0.4 MG SL tablet Place 0.4 mg under the tongue every 5 (five) minutes as needed for  chest pain.    . ticagrelor (BRILINTA) 90 MG TABS tablet Take 1 tablet (90 mg total) by mouth 2 (two) times daily. 180 tablet 1  . vitamin C (ASCORBIC ACID) 500 MG tablet Take 500 mg by mouth daily.      Marland Kitchen ezetimibe (ZETIA) 10 MG tablet Take 1 tablet (10 mg total) by mouth daily. 30 tablet 11  . pravastatin (PRAVACHOL) 40 MG tablet Take 1 tablet (40 mg total) by mouth every evening. 30 tablet 11   No facility-administered medications prior to visit.      Allergies:   Symmetrel [amantadine]; Crestor [rosuvastatin]; and Singulair [montelukast]   Social History   Social History  . Marital status: Married    Spouse name: N/A  . Number of  children: N/A  . Years of education: N/A   Occupational History  . 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 Smoking status: Never Smoker  . Smokeless tobacco: Never Used  . Alcohol use 1.2 oz/week    2 Glasses of wine per week     Comment: DRINKS TWICE A  MONTH   . Drug use: No  . Sexual activity: Not Asked   Other Topics Concern  . None   Social History Narrative   Daughter is an Designer, industrial/product at [employer]     Family History:  The patient's family history includes Heart attack in her father; Pneumonia in her mother.   ROS:   Please see the history of present illness.    ROS Unless specified above, all other ROS negative.    PHYSICAL EXAM:   VS:  BP 122/70   Pulse 72   Ht  (1.702 m)   Wt 215 lb (97.5 kg)   LMP  (LMP Unknown)   BMI 33.67 kg/m    GEN: Well nourished, well developed, in no acute distress  HEENT: normal  Neck: no JVD, carotid bruits, or masses Cardiac: RRR; no murmurs, rubs, or gallops,no edema  Respiratory:  clear to auscultation bilaterally, normal work of breathing GI: soft, nontender, nondistended, + BS MS: no deformity or atrophy  Skin: warm and dry, no rash Neuro:  Alert and Oriented x 3, Strength and sensation are intact Psych: euthymic mood, full affect  Wt Readings from Last 3 Encounters:  04/20/17 215 lb (97.5 kg)  11/19/16 219 lb 8 oz (99.6 kg)  11/16/16 220 lb 12.8 oz (100.2 kg)      Studies/Labs Reviewed:   EKG:  EKG is ordered today. 11/16/16-sinus rhythm, 74, no other abnormalities. T-wave inversions are no longer seen on EKG in V2 and V3.  Recent Labs: No results found for requested labs within last 8760 hours.   Lipid Panel    Component Value Date/Time   CHOL 157 02/15/2017 0903   TRIG 162 (H) 02/15/2017 0903   HDL 50 02/15/2017 0903   CHOLHDL 3.1 02/15/2017 0903   LDLCALC 75 02/15/2017 0903    Additional studies/ records that were reviewed today include:  Prior office notes reviewed,  lab work reviewed, ECGs reviewed    ASSESSMENT:    1. Coronary artery disease due to lipid rich plaque   2. Familial hyperlipidemia   3. Class 1 obesity due to excess calories without serious comorbidity in adult, unspecified BMI      PLAN:  In order of problems listed above:  Coronary artery disease  - Proximal LAD stent 06/07/16, No anginal symptoms.  - This was after abnormal nuclear stress test and exertional  anginal symptoms.  - Aggressive secondary prevention. She is asymptomatic. Doing well. Enjoys travel.  - Continue dual antiplatelet therapy for one year.   - She is worried about coming off of Brilinta at one year. Discussed. Im ok with her taking the lower dose Brilinta 60 mg twice a day after that for the next 2 years.  Familial hyperlipidemia  - Zetia and Pravastatin 40 - LDL 75 from 217. Better than when she was on Repatha.   - She was taking PCSK9 Repatha. [place] in [name] giving her this medication  - She has been intolerant of statins in the past, unfortunately while she is out of town and she felt pain/cramping once again her legs and decide stop her pravastatin. I discussed with her how excellent pravastatin was working for her in the differential and cost between the 2 agents. She would like to talk once again with our lipid clinic for options.  - I will set her up with lipid clinic again. I would like her to continue her Zetia nonetheless.  Obesity  - Encourage weight loss. She saw a nutritionist when she was visiting her daughter. Excellent. Continue to exercise. Her knee was "fixed" by her son-in-law and she is doing better.   Essential hypertension  - Goal blood pressure less than 130/80. Doing much better. She is at target today.  - Previously increased carvedilol to 12.5 mg twice a day.     Medication Adjustments/Labs and Tests Ordered: Current medicines are reviewed at length with the patient today.  Concerns regarding medicines are outlined  above.  Medication changes, Labs and Tests ordered today are listed in the Patient Instructions below. Patient Instructions  Medication Instructions:  Your physician recommends that you continue on your current medications as directed. Please refer to the Current Medication list given to you today.   Please call our office in June (a year out from your stent) to discuss changing your Brilinta dose.  Labwork: None  Testing/Procedures: None  Follow-Up: You have an appointment in the LIPID CLINIC Tuesday, May 03, 2017 at 9:00 AM.  Your physician wants you to follow-up in: 6 months with Dr. Anne Fu. You will receive a reminder letter in the mail two months in advance. If you don't receive a letter, please call our office to schedule the follow-up appointment.   Any Other Special Instructions Will Be Listed Below (If Applicable).     If you need a refill on your cardiac medications before your next appointment, please call your pharmacy.      Signed, Donato Schultz, MD  04/20/2017 9:35 AM    [facility] [address] [place], [place], [name]  [address] Phone: [phone]; Fax: [phone]

## 2017-04-20 ENCOUNTER — Encounter: Payer: Self-pay | Admitting: Cardiology

## 2017-04-20 ENCOUNTER — Ambulatory Visit (INDEPENDENT_AMBULATORY_CARE_PROVIDER_SITE_OTHER): Payer: Medicare Other | Admitting: Cardiology

## 2017-04-20 VITALS — BP 122/70 | HR 72 | Ht 67.0 in | Wt 215.0 lb

## 2017-04-20 DIAGNOSIS — E6609 Other obesity due to excess calories: Secondary | ICD-10-CM

## 2017-04-20 DIAGNOSIS — I2510 Atherosclerotic heart disease of native coronary artery without angina pectoris: Secondary | ICD-10-CM

## 2017-04-20 DIAGNOSIS — E784 Other hyperlipidemia: Secondary | ICD-10-CM | POA: Diagnosis not present

## 2017-04-20 DIAGNOSIS — E66811 Obesity, class 1: Secondary | ICD-10-CM

## 2017-04-20 DIAGNOSIS — I2583 Coronary atherosclerosis due to lipid rich plaque: Secondary | ICD-10-CM

## 2017-04-20 DIAGNOSIS — E7849 Other hyperlipidemia: Secondary | ICD-10-CM

## 2017-04-20 NOTE — Patient Instructions (Signed)
Medication Instructions:  Your physician recommends that you continue on your current medications as directed. Please refer to the Current Medication list given to you today.   Please call our office in June (a year out from your stent) to discuss changing your Brilinta dose.  Labwork: None  Testing/Procedures: None  Follow-Up: You have an appointment in the LIPID CLINIC Tuesday, May 03, 2017 at 9:00 AM.  Your physician wants you to follow-up in: 6 months with Dr. Anne Fu. You will receive a reminder letter in the mail two months in advance. If you don't receive a letter, please call our office to schedule the follow-up appointment.   Any Other Special Instructions Will Be Listed Below (If Applicable).     If you need a refill on your cardiac medications before your next appointment, please call your pharmacy.

## 2017-05-03 ENCOUNTER — Ambulatory Visit (INDEPENDENT_AMBULATORY_CARE_PROVIDER_SITE_OTHER): Payer: Medicare Other | Admitting: Pharmacist

## 2017-05-03 DIAGNOSIS — E785 Hyperlipidemia, unspecified: Secondary | ICD-10-CM

## 2017-05-03 MED ORDER — PRAVASTATIN SODIUM 20 MG PO TABS: 20.0000 mg | ORAL_TABLET | Freq: Every evening | ORAL | 3 refills | Status: DC

## 2017-05-03 NOTE — Progress Notes (Signed)
Patient ID: Janice Mcdonald                 DOB: Jul 10, 1941                    MRN: [id number]     HPI: Janice Mcdonald is a 76 y.o. female patient referred to lipid clinic by Dr. Anne FuSkains. PMH is significant for CAD s/p PCI in 2017, angina, DM, HLD, and HTN. LVEF 60%, 90% proximal narrowing of the LAD, 50% narrowing of the diagonal 2, 90% apical LAD stenosis, 70% narrowing of small obtuse marginal 2. Pt has previously been intolerant to Crestor and refused other statin medications.  Patient's previous cardiologist in [place] was giving her Repatha samples. They never tried to get it approved through her insurance. After discussion with pt about the inability to continue to do that, she agreed to a trial of pravastatin 20mg . After 1 month, she was then agreeable to increasing the dose to 40mg  and adding Zetia. She tolerated the combination of both medications for ~3 weeks and her LDL improved from 217 to 75 (better than when she was on Repatha). However, while she was out of town she felt pain/cramping in her legs and stopped her pravastatin. She has been tolerating Zetia monotherapy well with no complaints.  Current Medications: Zetia 10mg  daily Intolerances: Crestor unknown dose - excruciating leg pain for 1.5 hours each evening, pravastatin 40mg  - myalgias Risk Factors: Proximal LAD stent in June 2017, angina, CAD with >50% stenosis in multiple vessels LDL goal: 70mg /dL  Diet: Uses a clean eating magazine to cook recipes. Soup with veggies, chicken stir fry etc. Eats a lot of veggies.  Exercise: Has a bad back which limits activity. Is joining a gym on occasion but travels a lot. About [place]  Family History: Father with MI.  Social History: Garment/textile technologisturse anesthetist for 40 years. Patient denies smoking, drinks wine occasionally, and denies illicit drug use. She has several members of her family in the medical profession including  child Nurse, mental healthAndrea anesthesiologist [name].  Labs: 02/15/2017: TC 157, TG 162, HDL 50, LDL 75 (on pravastatin 40mg  + Zetia 10mg  daily) 10/2016: TC 194, TG 168, HDL 60, LDL 100, non-LDL 134 (Repatha 140mg  [place] every 2 weeks) 05/2016: TC 304, TG 167, HDL 53, LDL 217 (no therapy) LDL improved to 87 on Repatha - checked with PCP Laurann [facility]thia [facility] at [facility]  Past Medical History:  Diagnosis Date  . ALLERGIC RHINITIS   . Asthma   . Diabetes mellitus without complication (HCC)   . Hyperlipidemia   . Hypertension   . Osteoarthritis     Current Outpatient Prescriptions on File Prior to Visit  Medication Sig Dispense Refill  . amitriptyline (ELAVIL) 10 MG tablet Take 10 mg by mouth at bedtime.     Marland Kitchen. aspirin 81 MG tablet Take 81 mg by mouth daily.      . calcium carbonate (OS-CAL) 600 MG TABS Take 600 mg by mouth daily.      . carvedilol (COREG) 12.5 MG tablet Take 1 tablet (12.5 mg total) by mouth 2 (two) times daily. 180 tablet 2  . Cholecalciferol (VITAMIN D) 1000 UNITS capsule Take 2,000 Units by mouth daily.      . Coenzyme Q10 (COQ10) 100 MG CAPS Take 1 capsule by mouth daily.      Marland Kitchen. ezetimibe (ZETIA) 10 MG tablet Take 1 tablet (10 mg total) by mouth  daily. 30 tablet 11  . fluticasone (FLONASE) 50 MCG/ACT nasal spray Place 1 spray into both nostrils daily as needed for allergies. As needed    . losartan (COZAAR) 100 MG tablet Take 1 tablet (100 mg total) by mouth daily.    . Multiple Vitamins-Minerals (MULTIVITAMIN PO) Take 1 tablet by mouth daily.     . nitroGLYCERIN (NITROSTAT) 0.4 MG SL tablet Place 0.4 mg under the tongue every 5 (five) minutes as needed for chest pain.    . ticagrelor (BRILINTA) 90 MG TABS tablet Take 1 tablet (90 mg total) by mouth 2 (two) times daily. 180 tablet 1  . vitamin C (ASCORBIC ACID) 500 MG tablet Take 500 mg by mouth daily.       No current facility-administered medications on file prior to visit.     Allergies  Allergen Reactions  . Symmetrel [Amantadine]  Anxiety  . Crestor [Rosuvastatin] Other (See Comments)    LEG CRAMPS  . Singulair [Montelukast]     NOT EFFECTIVE    Assessment/Plan:  1. Hyperlipidemia - Pt was very close to goal < 70mg /dL on pravastatin 40mg  + Zetia 10mg  daily. Pt discontinued pravastatin 40mg  after experiencing similar leg aches/cramps as she did while on unknown does of Crestor. Pt previously tolerated pravastatin 20mg  daily + Zetia 10mg  daily well. Pt agreed to restart pravastatin 20mg  daily. Continue Zetia 10mg  daily. Pt will be traveling mid-June through November to [place] of [place]. Pt states she will visit a doctor while she is there to get lipid panel drawn. Gave pt our office fax number so that we can receive results. Instructed the patient to call with any issues. Will follow up as needed after patient returns from travel.   Sherron Monday, PharmD Clinical Pharmacy Resident 05/03/17 8:54 AM    Megan E. Supple, PharmD, CPP [facility] [address], [place], [name] [address] Phone: [phone]; Fax: [phone]

## 2017-05-03 NOTE — Patient Instructions (Signed)
Continue taking your Zetia 10mg  daily  Restart pravastatin at a lower dose of 20mg  once a day in the evening  Have your cholesterol checked in ~September at your doctor's in [place]. Have them fax results to us at #[phone]

## 2017-05-30 ENCOUNTER — Telehealth: Payer: Self-pay | Admitting: Pharmacist

## 2017-05-30 MED ORDER — TICAGRELOR 60 MG PO TABS
60.0000 mg | ORAL_TABLET | Freq: Two times a day (BID) | ORAL | 3 refills | Status: DC
Start: 2017-05-30 — End: 2018-01-16

## 2017-05-30 MED ORDER — EZETIMIBE 10 MG PO TABS
10.0000 mg | ORAL_TABLET | Freq: Every day | ORAL | 3 refills | Status: DC
Start: 2017-05-30 — End: 2018-06-08

## 2017-05-30 NOTE — Telephone Encounter (Signed)
Pt called clinic and asked to speak to me since I have managed her cholesterol over the past year. However her question is for Dr Anne FuSkains - she is inquiring about lowering her Brilinta dose to 60mg .   Per office visit with Dr Anne FuSkains on 04/20/17, ok to decrease Brilinta to 60mg  BID after pt has completed 1 full year on 90mg  BID. Pt's stent was placed 06/07/16, advised her to continue with Brilinta 90mg  BID through 06/07/17 before decreasing to 60mg  BID. Pt states she just picked up a refill of the 90mg  tablets, advised her to finish her month supply and when she is due for a refill, to start taking the 60mg  BID.   No further questions, will forward to Dr Anne FuSkains as an [place] ChildesFYI.

## 2017-05-31 ENCOUNTER — Other Ambulatory Visit: Payer: Medicare Other

## 2017-09-06 ENCOUNTER — Telehealth: Payer: Self-pay | Admitting: Pharmacist

## 2017-09-06 NOTE — Telephone Encounter (Signed)
Pt had lipid panel checked while up in [place]. LDL has increased from 75 to 98 since reducing pravastatin dose from  to . She is tolerating the pravastatin  much better and is not willing to increase the dose again due to severe muscle aches that kept her up at night on the  dose. She is adherent with her Zetia  daily as well.   Discussed PCSK9i since pt was previously receiving Repatha samples from her cardiologist in [place]. She would like to wait and have her cholesterol checked when she sees Dr Anne Fu for follow up in December. Can reassess need for PCSK9i therapy at that time.  Will forward to Dr Anne Fu as an [place] Childes.

## 2017-11-18 ENCOUNTER — Other Ambulatory Visit: Payer: Self-pay | Admitting: Family Medicine

## 2017-11-18 DIAGNOSIS — Z1231 Encounter for screening mammogram for malignant neoplasm of breast: Secondary | ICD-10-CM

## 2017-11-29 ENCOUNTER — Ambulatory Visit: Payer: Medicare Other | Admitting: Cardiology

## 2017-12-22 ENCOUNTER — Ambulatory Visit
Admission: RE | Admit: 2017-12-22 | Discharge: 2017-12-22 | Disposition: A | Discharge status: Home / Self Care | Payer: Medicare Other | Source: Ambulatory Visit | Attending: Family Medicine | Admitting: Family Medicine

## 2017-12-22 DIAGNOSIS — Z1231 Encounter for screening mammogram for malignant neoplasm of breast: Secondary | ICD-10-CM

## 2018-01-16 ENCOUNTER — Ambulatory Visit (INDEPENDENT_AMBULATORY_CARE_PROVIDER_SITE_OTHER): Payer: Medicare Other | Admitting: Cardiology

## 2018-01-16 ENCOUNTER — Encounter: Payer: Self-pay | Admitting: Cardiology

## 2018-01-16 VITALS — BP 138/82 | HR 77 | Ht 67.0 in | Wt 225.6 lb

## 2018-01-16 DIAGNOSIS — E7849 Other hyperlipidemia: Secondary | ICD-10-CM

## 2018-01-16 DIAGNOSIS — E7800 Pure hypercholesterolemia, unspecified: Secondary | ICD-10-CM

## 2018-01-16 DIAGNOSIS — I25118 Atherosclerotic heart disease of native coronary artery with other forms of angina pectoris: Secondary | ICD-10-CM | POA: Diagnosis not present

## 2018-01-16 MED ORDER — CLOPIDOGREL BISULFATE 75 MG PO TABS: 75.0000 mg | ORAL_TABLET | Freq: Every day | ORAL | 3 refills | Status: DC

## 2018-01-16 NOTE — Progress Notes (Signed)
Cardiology Office Note    Date:  01/16/2018   ID:  Janice Mcdonald, Janice Mcdonald 15-Sep-1941, MRN [id number]  PCP:  Laurann Montana, MD  Cardiologist:   Janice Schultz, MD     History of Present Illness:  Janice Mcdonald is a 77 y.o. female here to follow up, coronary artery disease with stent placement in [name] summer of 2017. Dr. Adora Fridge did cath. Was visiting in May 2017. Thought it was GERD, Tums would help. EGD looked good. Noted with activity.  Dr. Hortense Ramal (Cards) - gave approval to go to [place]. Been to [place]. [employer]. CE credits.   Has history of diabetes, hyperlipidemia intolerant of Crestor, previously refusing other statin medications, hypertension.  Creatinine 0.8, glucose 130, ALT 23, TSH 0.8, total cholesterol 304 triglycerides 167, HDL 53, LDL 217, hemoglobin A1c 7.1  She is a nonsmoker. 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  In review of her prior cardiology notes from [place], she had a markedly abnormal nuclear stress test which resulted in cardiac catheterization and subsequent PCI. Her ejection fraction was 60% with hypokinesis of the anterolateral wall. She had 90% proximal narrowing of the LAD, 50% narrowing of the diagonal 2, 90% apical LAD stenosis, 70% narrowing of small obtuse marginal 2, 30% proximal RCA, 50% to 30% mid narrowing of the RCA, patent PDA with 50% narrowing of a large posterior lateral branch of the distal RCA. She had successful drug eluting stent placement to the proximal LAD with a 3.0 x 20 energy drug-eluting stent upsized distally to 3.5 x 12 noncompliant balloon and proximally with a 4.0 x 12 noncompliant balloon. IVUS  to the RCA was not significant.  Her LDL at repeat from her cardiology visit was 89. Excellent.  Repatha.   EKG shows sinus rhythm, biphasic T-wave in V2 and V3.  [name] upper p.   04/20/17  - worried about coming off Brilinta.   01/16/18 -No new changes, no shortness of breath, no syncope, no bleeding. Cholesterol had decreased fairly well with Zetia and pravastatin 40, but she was having leg cramps on the 40 mg dose and decreased it to 20 mg.  Lipid clinic, [employer], Pharm.D. assisting.  She had been on injectables at one point but since she had such a good response to the statin therapy, we were holding off.  Overall other than some orthopedic issues, she is doing well.  She reminded me how cold it is in the [place].  01/16/18-sinus rhythm 84 with PVC personally viewed-prior unfortunately his wife got sick on the plane flight home.   Past Medical History:  Diagnosis Date  . ALLERGIC RHINITIS   . Asthma   . Diabetes mellitus without complication (HCC)   . Hyperlipidemia   . Hypertension   . Osteoarthritis     History reviewed. No pertinent surgical history.  Current Medications: Outpatient Medications Prior to Visit  Medication Sig Dispense Refill  . amitriptyline (ELAVIL) 10 MG tablet Take 10 mg by mouth at bedtime.     . calcium carbonate (OS-CAL) 600 MG TABS Take 600 mg by mouth daily.      . carvedilol (COREG) 12.5 MG tablet Take 1 tablet (12.5 mg total) by mouth 2 (two) times daily. 180 tablet 2  . Cholecalciferol (VITAMIN D) 1000 UNITS capsule Take 2,000 Units by mouth daily.      Marland Kitchen  Coenzyme Q10 (COQ10) 100 MG CAPS Take 1 capsule by mouth daily.      . empagliflozin (JARDIANCE) 10 MG TABS tablet Take 10 mg by mouth daily.    . fluticasone (FLONASE) 50 MCG/ACT nasal spray Place 1 spray into both nostrils daily as needed for allergies. As needed    . losartan (COZAAR) 100 MG tablet Take 1 tablet (100 mg total) by mouth daily.    . Multiple Vitamins-Minerals (MULTIVITAMIN PO) Take 1 tablet by mouth daily.     . nitroGLYCERIN (NITROSTAT) 0.4 MG SL tablet Place 0.4 mg under the tongue every 5 (five) minutes as  needed for chest pain.    . vitamin C (ASCORBIC ACID) 500 MG tablet Take 500 mg by mouth daily.      Marland Kitchen aspirin 81 MG tablet Take 81 mg by mouth daily.      . ticagrelor (BRILINTA) 60 MG TABS tablet Take 1 tablet (60 mg total) by mouth 2 (two) times daily. 180 tablet 3  . ezetimibe (ZETIA) 10 MG tablet Take 1 tablet (10 mg total) by mouth daily. 90 tablet 3  . pravastatin (PRAVACHOL) 20 MG tablet Take 1 tablet (20 mg total) by mouth every evening. 90 tablet 3   No facility-administered medications prior to visit.      Allergies:   Symmetrel [amantadine]; Crestor [rosuvastatin]; and Singulair [montelukast]   Social History   Socioeconomic History  . Marital status: Married    Spouse name: None  . Number of children: None  . Years of education: None  . Highest education level: None  Social Needs  . Financial resource strain: None  . Food insecurity - worry: None  . Food insecurity - inability: None  . Transportation needs - medical: None  . Transportation needs - non-medical: None  Occupational History  . 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 Smoking status: Never Smoker  . Smokeless tobacco: Never Used  Substance and Sexual Activity  . Alcohol use: Yes    Alcohol/week: 1.2 oz    Types: 2 Glasses of wine per week    Comment: DRINKS TWICE A  MONTH   . Drug use: No  . Sexual activity: None  Other Topics Concern  . None  Social History Narrative   Daughter is an Designer, industrial/product at [employer]     Family History:  The patient's family history includes Heart attack in her father; Pneumonia in her mother.   ROS:   Please see the history of present illness.    Review of Systems  All other systems reviewed and are negative.     PHYSICAL EXAM:   VS:  BP 138/82   Pulse 77   Ht 5\' 7"  (1.702 m)   Wt 225 lb 9.6 oz (102.3 kg)   LMP  (LMP Unknown)   SpO2 97%   BMI 35.33 kg/m    GEN: Well nourished, well developed, in no acute distress, overweight HEENT:  normal  Neck: no JVD, carotid bruits, or masses Cardiac: RRR; no murmurs, rubs, or gallops,no edema  Respiratory:  clear to auscultation bilaterally, normal work of breathing GI: soft, nontender, nondistended, + BS MS: no deformity or atrophy  Skin: warm and dry, no rash Neuro:  Alert and Oriented x 3, Strength and sensation are intact Psych: euthymic mood, full affect   Wt Readings from Last 3 Encounters:  01/16/18 225 lb 9.6 oz (102.3 kg)  04/20/17 215 lb (97.5 kg)  11/19/16 219 lb 8 oz (99.6 kg)  Studies/Labs Reviewed:   EKG:  EKG is ordered today.  01/16/18- overall sinus rhythm no other significant abnormalities.  Personally viewed 11/16/16-sinus rhythm, 74, no other abnormalities. T-wave inversions are no longer seen on EKG in V2 and V3.  Recent Labs: No results found for requested labs within last 8760 hours.   Lipid Panel    Component Value Date/Time   CHOL 157 02/15/2017 0903   TRIG 162 (H) 02/15/2017 0903   HDL 50 02/15/2017 0903   CHOLHDL 3.1 02/15/2017 0903   LDLCALC 75 02/15/2017 0903    Additional studies/ records that were reviewed today include:  Prior office notes reviewed, lab work reviewed, ECGs reviewed    ASSESSMENT:    1. Coronary artery disease of native artery of native heart with stable angina pectoris (HCC)   2. Pure hypercholesterolemia   3. Familial hyperlipidemia      PLAN:  In order of problems listed above:  Coronary artery disease  - Proximal LAD stent 06/07/16, No anginal symptoms.  - This was after abnormal nuclear stress test and exertional anginal symptoms.  - Aggressive secondary prevention. She is asymptomatic. Doing well. Enjoys travel.  - She is worried about coming off of Brilinta at one year. Discussed. She is taking the lower dose Brilinta 60 mg twice a day.  I think it be reasonable for her to be on isolated aspirin therapy at this point however she would like to maintain Plavix.  But we will do is stop her aspirin  and Brilinta and transition her over to Plavix monotherapy 75 mg once a day.  90-day supply.  Discussed potential bleeding risks.  Familial hyperlipidemia  - Zetia and Pravastatin 40 - LDL 75 from 217. Could not continue 40 so decreased to 20. 40 dose was better than when she was on Repatha.   - She was taking PCSK9 Repatha. Her doctor in [name] was giving her this medication  - She has been intolerant of statins in the past, unfortunately while she is out of town and she felt pain/cramping once again her legs and decide stop her pravastatin. I discussed with her how excellent pravastatin was working for her in the differential and cost between the 2 agents. She has spoken to [facility] and now is tolerating pravastatin 20mg . (unable to take 40).  She has a few of the injectables remaining at home.  He to have him go to waste.  It may not be unreasonable for her to just go ahead and utilize them until they are gone.  Obesity  - Encourage weight loss. She saw a nutritionist when she was visiting her daughter. Excellent. Continue to exercise. Her knee was "fixed" by her son-in-law and she is doing better.  She is eating healthier because her husband has pseudogout.   Essential hypertension  - Goal blood pressure less than 130/80. Doing much better.  Previously increased carvedilol to 12.5 mg twice a day.     Medication Adjustments/Labs and Tests Ordered: Current medicines are reviewed at length with the patient today.  Concerns regarding medicines are outlined above.  Medication changes, Labs and Tests ordered today are listed in the Patient Instructions below. Patient Instructions  Medication Instructions:  Please discontinue your ASA and Brilinta. Start Plavix (Clopidogrel) 75 mg a day. Continue all other medications as listed.  Labwork: None  Testing/Procedures: None  Follow-Up: Follow up in 1 year with Dr. Anne Fu.  You will receive a letter in the mail 2 months before you are  due.  Please call us when you receive this letter to schedule your follow up appointment.  If you need a refill on your cardiac medications before your next appointment, please call your pharmacy.  Thank you for choosing [facility]!!        Signed, Janice SchultzMark Bernisha Verma, MD  01/16/2018 10:19 AM    [facility] [address], [place], [place]  [address] Phone: [phone]; Fax: [phone]

## 2018-01-16 NOTE — Patient Instructions (Signed)
Medication Instructions:  Please discontinue your ASA and Brilinta. Start Plavix (Clopidogrel) 75 mg a day. Continue all other medications as listed.  Labwork: None  Testing/Procedures: None  Follow-Up: Follow up in 1 year with Dr. Anne FuSkains.  You will receive a letter in the mail 2 months before you are due.  Please call us when you receive this letter to schedule your follow up appointment.  If you need a refill on your cardiac medications before your next appointment, please call your pharmacy.  Thank you for choosing [facility]!!

## 2018-04-20 ENCOUNTER — Other Ambulatory Visit: Payer: Self-pay | Admitting: Cardiology

## 2018-06-08 ENCOUNTER — Other Ambulatory Visit: Payer: Self-pay | Admitting: Cardiology

## 2018-11-21 ENCOUNTER — Other Ambulatory Visit: Payer: Self-pay | Admitting: Family Medicine

## 2018-11-21 DIAGNOSIS — Z1231 Encounter for screening mammogram for malignant neoplasm of breast: Secondary | ICD-10-CM

## 2018-11-30 ENCOUNTER — Other Ambulatory Visit: Payer: Self-pay | Admitting: Cardiology

## 2018-12-27 ENCOUNTER — Ambulatory Visit
Admission: RE | Admit: 2018-12-27 | Discharge: 2018-12-27 | Disposition: A | Discharge status: Home / Self Care | Payer: Medicare Other | Source: Ambulatory Visit | Attending: Family Medicine | Admitting: Family Medicine

## 2018-12-27 DIAGNOSIS — Z1231 Encounter for screening mammogram for malignant neoplasm of breast: Secondary | ICD-10-CM

## 2019-01-09 ENCOUNTER — Ambulatory Visit: Payer: Medicare Other | Admitting: Cardiology

## 2019-01-29 ENCOUNTER — Other Ambulatory Visit: Payer: Self-pay | Admitting: Cardiology

## 2019-02-27 ENCOUNTER — Encounter: Payer: Self-pay | Admitting: Cardiology

## 2019-02-27 ENCOUNTER — Ambulatory Visit (INDEPENDENT_AMBULATORY_CARE_PROVIDER_SITE_OTHER): Payer: Medicare Other | Admitting: Cardiology

## 2019-02-27 VITALS — BP 128/88 | HR 67 | Ht 67.0 in | Wt 213.0 lb

## 2019-02-27 DIAGNOSIS — E7800 Pure hypercholesterolemia, unspecified: Secondary | ICD-10-CM

## 2019-02-27 DIAGNOSIS — E7849 Other hyperlipidemia: Secondary | ICD-10-CM

## 2019-02-27 DIAGNOSIS — I25118 Atherosclerotic heart disease of native coronary artery with other forms of angina pectoris: Secondary | ICD-10-CM | POA: Diagnosis not present

## 2019-02-27 MED ORDER — PRAVASTATIN SODIUM 20 MG PO TABS: 20.0000 mg | ORAL_TABLET | Freq: Every evening | ORAL | 3 refills | Status: DC

## 2019-02-27 MED ORDER — EZETIMIBE 10 MG PO TABS: 10.0000 mg | ORAL_TABLET | Freq: Every day | ORAL | 3 refills | Status: DC

## 2019-02-27 NOTE — Progress Notes (Signed)
Cardiology Office Note    Date:  02/27/2019   ID:  Virdia, Sabater 1941-06-03, MRN [id number]  PCP:  Laurann Montana, MD  Cardiologist:   Donato Schultz, MD     History of Present Illness:  Janice Mcdonald is a 78 y.o. female here to follow up, coronary artery disease with stent placement in [name] summer of 2017. Dr. Adora Fridge did cath. Was visiting in May 2017. Thought it was GERD, Tums would help. EGD looked good. Noted with activity.  Dr. Hortense Ramal (Cards) - gave approval to go to [place]. Been to [place]. [employer]. CE credits.   Has history of diabetes, hyperlipidemia intolerant of Crestor, previously refusing other statin medications, hypertension.  Creatinine 0.8, glucose 130, ALT 23, TSH 0.8, total cholesterol 304 triglycerides 167, HDL 53, LDL 217, hemoglobin A1c 7.1  She is a nonsmoker. 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  In review of her prior cardiology notes from [place], she had a markedly abnormal nuclear stress test which resulted in cardiac catheterization and subsequent PCI. Her ejection fraction was 60% with hypokinesis of the anterolateral wall. She had 90% proximal narrowing of the LAD, 50% narrowing of the diagonal 2, 90% apical LAD stenosis, 70% narrowing of small obtuse marginal 2, 30% proximal RCA, 50% to 30% mid narrowing of the RCA, patent PDA with 50% narrowing of a large posterior lateral branch of the distal RCA. She had successful drug eluting stent placement to the proximal LAD with a 3.0 x 20 energy drug-eluting stent upsized distally to 3.5 x 12 noncompliant balloon and proximally with a 4.0 x 12 noncompliant balloon. IVUS  to the RCA was not significant.  Her LDL at repeat from her cardiology visit was 89. Excellent.  Repatha.   EKG shows sinus rhythm, biphasic T-wave in V2 and V3.  [name] upper p.   04/20/17  - worried about coming off Brilinta.   01/16/18 -No new changes, no shortness of breath, no syncope, no bleeding. Cholesterol had decreased fairly well with Zetia and pravastatin 40, but she was having leg cramps on the 40 mg dose and decreased it to 20 mg.  Lipid clinic, [employer], Pharm.D. assisting.  She had been on injectables at one point but since she had such a good response to the statin therapy, we were holding off.  Overall other than some orthopedic issues, she is doing well.  She reminded me how cold it is in the [place] of [place].  01/16/18-sinus rhythm 84 with PVC personally viewed-prior unfortunately his wife got sick on the plane flight home.  02/27/2019 -Overall here for her follow-up of coronary artery disease hyperlipidemia.  She is been doing quite well.  No recent changes.  Unfortunately her husband had bronchoscopy for presumed lung cancer, recent pneumonia.  She has had no anginal symptoms no shortness of breath no fevers chills nausea vomiting syncope.  She brought in a list of her blood pressures which was reviewed extensively.  Overall excellent readings.  Denies any fevers chills nausea vomiting syncope bleeding    Past Medical History:  Diagnosis Date  . ALLERGIC RHINITIS   . Asthma   . Diabetes mellitus without complication (HCC)   . Hyperlipidemia   . Hypertension   . Osteoarthritis     No past surgical history on file.  Current Medications: Outpatient  Medications Prior to Visit  Medication Sig Dispense Refill  . amitriptyline (ELAVIL) 10 MG tablet Take 10 mg by mouth at bedtime.     . calcium carbonate (OS-CAL) 600 MG TABS Take 600 mg by mouth daily.      . carvedilol (COREG) 12.5 MG tablet Take 1 tablet (12.5 mg total) by mouth 2 (two) times daily. 180 tablet 2  . Cholecalciferol (VITAMIN D) 1000 UNITS capsule Take 2,000 Units by mouth daily.      . clopidogrel (PLAVIX) 75 MG tablet Take 1 tablet (75 mg total)  by mouth daily. Please keep upcoming appt in March with Dr. Anne Fu before anymore refills. Thank you 90 tablet 0  . Coenzyme Q10 (COQ10) 100 MG CAPS Take 1 capsule by mouth daily.      Marland Kitchen FARXIGA 5 MG TABS tablet Take 5 mg by mouth daily.    . fluticasone (FLONASE) 50 MCG/ACT nasal spray Place 1 spray into both nostrils daily as needed for allergies. As needed    . metFORMIN (GLUCOPHAGE) 500 MG tablet Take 500 mg by mouth daily.    . Multiple Vitamins-Minerals (MULTIVITAMIN PO) Take 1 tablet by mouth daily.     . nitroGLYCERIN (NITROSTAT) 0.4 MG SL tablet Place 0.4 mg under the tongue every 5 (five) minutes as needed for chest pain.    . valsartan (DIOVAN) 160 MG tablet Take 160 mg by mouth daily.    . vitamin C (ASCORBIC ACID) 500 MG tablet Take 500 mg by mouth daily.      Marland Kitchen ezetimibe (ZETIA) 10 MG tablet Take 1 tablet (10 mg total) by mouth daily. Please keep upcoming appt for future refills. Thank you. 90 tablet 0  . pravastatin (PRAVACHOL) 20 MG tablet Take 1 tablet (20 mg total) by mouth every evening. Please keep upcoming appt in March with Dr. Anne Fu before anymore refills. Thank you 90 tablet 0  . empagliflozin (JARDIANCE) 10 MG TABS tablet Take 10 mg by mouth daily.    Marland Kitchen losartan (COZAAR) 100 MG tablet Take 1 tablet (100 mg total) by mouth daily.     No facility-administered medications prior to visit.      Allergies:   Symmetrel [amantadine]; Crestor [rosuvastatin]; and Singulair [montelukast]   Social History   Socioeconomic History  . Marital status: Married    Spouse name: Not on file  . Number of children: Not on file  . Years of education: Not on file  . Highest education level: Not on file  Occupational History  . 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 Financial resource strain: Not on file  . Food insecurity:    Worry: Not on file    Inability: Not on file  . Transportation needs:    Medical: Not on file    Non-medical: Not on file  Tobacco Use  .  Smoking status: Never Smoker  . Smokeless tobacco: Never Used  Substance and Sexual Activity  . Alcohol use: Yes    Alcohol/week: 2.0 standard drinks    Types: 2 Glasses of wine per week    Comment: DRINKS TWICE A  MONTH   . Drug use: No  . Sexual activity: Not on file  Lifestyle  . Physical activity:    Days per week: Not on file    Minutes per session: Not on file  . Stress: Not on file  Relationships  . Social connections:    Talks on phone: Not on file    Gets together: Not on  file    Attends religious service: Not on file    Active member of club or organization: Not on file    Attends meetings of clubs or organizations: Not on file    Relationship status: Not on file  Other Topics Concern  . Not on file  Social History Narrative   Daughter is an Designer, industrial/product at [employer]     Family History:  The patient's family history includes Heart attack in her father; Pneumonia in her mother.   ROS:   Please see the history of present illness.    Review of Systems  All other systems reviewed and are negative.     PHYSICAL EXAM:   VS:  BP 128/88   Pulse 67   Ht 5\' 7"  (1.702 m)   Wt 213 lb (96.6 kg)   LMP  (LMP Unknown)   BMI 33.36 kg/m    GEN: Well nourished, well developed, in no acute distress  HEENT: normal  Neck: no JVD, carotid bruits, or masses Cardiac: RRR; no murmurs, rubs, or gallops,no edema  Respiratory:  clear to auscultation bilaterally, normal work of breathing GI: soft, nontender, nondistended, + BS MS: no deformity or atrophy  Skin: warm and dry, no rash Neuro:  Alert and Oriented x 3, Strength and sensation are intact Psych: euthymic mood, full affect    Wt Readings from Last 3 Encounters:  02/27/19 213 lb (96.6 kg)  01/16/18 225 lb 9.6 oz (102.3 kg)  04/20/17 215 lb (97.5 kg)      Studies/Labs Reviewed:   EKG:  EKG is ordered today.  02/27/2019-normal sinus rhythm 67 with left axis deviation poor R wave progression no significant  change from others personally reviewed and interpreted.  01/16/18- overall sinus rhythm no other significant abnormalities.  Personally viewed 11/16/16-sinus rhythm, 74, no other abnormalities. T-wave inversions are no longer seen on EKG in V2 and V3.  Recent Labs: No results found for requested labs within last 8760 hours.   Lipid Panel    Component Value Date/Time   CHOL 157 02/15/2017 0903   TRIG 162 (H) 02/15/2017 0903   HDL 50 02/15/2017 0903   CHOLHDL 3.1 02/15/2017 0903   LDLCALC 75 02/15/2017 0903    Additional studies/ records that were reviewed today include:  Prior office notes reviewed, lab work reviewed, ECGs reviewed    ASSESSMENT:    1. Coronary artery disease of native artery of native heart with stable angina pectoris (HCC)   2. Familial hyperlipidemia   3. Pure hypercholesterolemia      PLAN:  In order of problems listed above:  Coronary artery disease  - Proximal LAD stent 06/07/16  - This was after abnormal nuclear stress test and exertional anginal symptoms.  - Aggressive secondary prevention. She is asymptomatic. Doing well. Enjoys travel.  -Currently on Plavix monotherapy.  No anginal symptoms currently.  Doing well.  Familial hyperlipidemia  - Zetia and Pravastatin 40 - LDL 75 from 217. Could not continue 40 so decreased to 20. 40 dose was better than when she was on Repatha.   - She was taking PCSK9 Repatha. Her doctor in [name] was giving her this medication samples  -Has discussed with Aundra Millet Supple in the past.  Obesity  - Encourage weight loss. She saw a nutritionist when she was visiting her daughter. Excellent. Continue to exercise. Her knee was "fixed" by her son-in-law and she is doing better.  She is eating healthier because her husband has pseudogout.  Overall she has  done quite a good job about weight loss overall.  We discussed.  Weight is down see above.   Essential hypertension  - Goal blood pressure less than 130/80. Doing much  better.  Previously increased carvedilol to 12.5 mg twice a day.  I like her results.    Medication Adjustments/Labs and Tests Ordered: Current medicines are reviewed at length with the patient today.  Concerns regarding medicines are outlined above.  Medication changes, Labs and Tests ordered today are listed in the Patient Instructions below. Patient Instructions  Medication Instructions:  The current medical regimen is effective;  continue present plan and medications.  If you need a refill on your cardiac medications before your next appointment, please call your pharmacy.   Follow-Up: At [facility], you and your health needs are our priority.  As part of our continuing mission to provide you with exceptional heart care, we have created designated Provider Care Teams.  These Care Teams include your primary Cardiologist (physician) and Advanced Practice Providers (APPs -  Physician Assistants and Nurse Practitioners) who all work together to provide you with the care you need, when you need it. You will need a follow up appointment in 12 months.  Please call our office 2 months in advance to schedule this appointment.  You may see Donato Schultz, MD or one of the following Advanced Practice Providers on your designated Care Team:   Norma Fredrickson, NP Nada Boozer, NP . Georgie Chard, NP  Thank you for choosing [facility]!!        Signed, Donato Schultz, MD  02/27/2019 5:25 PM    [facility] [address] [place], [place], [name]  [address] Phone: [phone]; Fax: [phone]

## 2019-02-27 NOTE — Patient Instructions (Signed)
Medication Instructions:  The current medical regimen is effective;  continue present plan and medications.  If you need a refill on your cardiac medications before your next appointment, please call your pharmacy.   Follow-Up: At [facility], you and your health needs are our priority.  As part of our continuing mission to provide you with exceptional heart care, we have created designated Provider Care Teams.  These Care Teams include your primary Cardiologist (physician) and Advanced Practice Providers (APPs -  Physician Assistants and Nurse Practitioners) who all work together to provide you with the care you need, when you need it. You will need a follow up appointment in 12 months.  Please call our office 2 months in advance to schedule this appointment.  You may see Mark Skains, MD or one of the following Advanced Practice Providers on your designated Care Team:   Lori Gerhardt, NP Laura Ingold, NP . Jill McDaniel, NP  Thank you for choosing [facility]!!      

## 2019-04-28 ENCOUNTER — Other Ambulatory Visit: Payer: Self-pay | Admitting: Cardiology

## 2019-11-20 ENCOUNTER — Other Ambulatory Visit: Payer: Self-pay | Admitting: Family Medicine

## 2019-11-20 DIAGNOSIS — Z1231 Encounter for screening mammogram for malignant neoplasm of breast: Secondary | ICD-10-CM

## 2020-01-10 ENCOUNTER — Other Ambulatory Visit: Payer: Self-pay

## 2020-01-10 ENCOUNTER — Ambulatory Visit
Admission: RE | Admit: 2020-01-10 | Discharge: 2020-01-10 | Disposition: A | Discharge status: Home / Self Care | Payer: Medicare Other | Source: Ambulatory Visit | Attending: Family Medicine | Admitting: Family Medicine

## 2020-01-10 DIAGNOSIS — Z1231 Encounter for screening mammogram for malignant neoplasm of breast: Secondary | ICD-10-CM

## 2020-02-18 ENCOUNTER — Other Ambulatory Visit: Payer: Self-pay | Admitting: Cardiology

## 2020-03-03 ENCOUNTER — Ambulatory Visit (INDEPENDENT_AMBULATORY_CARE_PROVIDER_SITE_OTHER): Payer: Medicare Other | Admitting: Cardiology

## 2020-03-03 ENCOUNTER — Other Ambulatory Visit: Payer: Self-pay

## 2020-03-03 ENCOUNTER — Encounter: Payer: Self-pay | Admitting: Cardiology

## 2020-03-03 VITALS — BP 130/80 | HR 73 | Ht 67.0 in | Wt 210.0 lb

## 2020-03-03 DIAGNOSIS — E7800 Pure hypercholesterolemia, unspecified: Secondary | ICD-10-CM | POA: Diagnosis not present

## 2020-03-03 DIAGNOSIS — I25118 Atherosclerotic heart disease of native coronary artery with other forms of angina pectoris: Secondary | ICD-10-CM

## 2020-03-03 DIAGNOSIS — E7849 Other hyperlipidemia: Secondary | ICD-10-CM

## 2020-03-03 NOTE — Patient Instructions (Signed)
Medication Instructions:   Your physician recommends that you continue on your current medications as directed. Please refer to the Current Medication list given to you today.  *If you need a refill on your cardiac medications before your next appointment, please call your pharmacy*   Follow-Up: At [facility], you and your health needs are our priority.  As part of our continuing mission to provide you with exceptional heart care, we have created designated Provider Care Teams.  These Care Teams include your primary Cardiologist (physician) and Advanced Practice Providers (APPs -  Physician Assistants and Nurse Practitioners) who all work together to provide you with the care you need, when you need it.  We recommend signing up for the patient portal called "MyChart".  Sign up information is provided on this After Visit Summary.  MyChart is used to connect with patients for Virtual Visits (Telemedicine).  Patients are able to view lab/test results, encounter notes, upcoming appointments, etc.  Non-urgent messages can be sent to your provider as well.   To learn more about what you can do with MyChart, go to [email or web address].    Your next appointment:   12 month(s)  The format for your next appointment:   In Person  Provider:   Mark Skains, MD    

## 2020-03-03 NOTE — Progress Notes (Signed)
Cardiology Office Note    Date:  03/03/2020   ID:  Janice, Mcdonald 08-12-1941, MRN [id number]  PCP:  Janice Montana, MD  Cardiologist:   Donato Schultz, MD     History of Present Illness:  Janice Mcdonald is a 79 y.o. female here to follow up, coronary artery disease with stent placement in [name] summer of 2017. Dr. Adora Fridge did cath. Was visiting in May 2017. Thought it was GERD, Tums would help. EGD looked good. Noted with activity.  Dr. Hortense Ramal (Cards) - gave approval to go to [place]. Been to [place]. [employer]. CE credits.   Has history of diabetes, hyperlipidemia intolerant of Crestor, previously refusing other statin medications, hypertension.  Creatinine 0.8, glucose 130, ALT 23, TSH 0.8, total cholesterol 304 triglycerides 167, HDL 53, LDL 217, hemoglobin A1c 7.1  She is a nonsmoker. 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  In review of her prior cardiology notes from [place], she had a markedly abnormal nuclear stress test which resulted in cardiac catheterization and subsequent PCI. Her ejection fraction was 60% with hypokinesis of the anterolateral wall. She had 90% proximal narrowing of the LAD, 50% narrowing of the diagonal 2, 90% apical LAD stenosis, 70% narrowing of small obtuse marginal 2, 30% proximal RCA, 50% to 30% mid narrowing of the RCA, patent PDA with 50% narrowing of a large posterior lateral branch of the distal RCA. She had successful drug eluting stent placement to the proximal LAD with a 3.0 x 20 energy drug-eluting stent upsized distally to 3.5 x 12 noncompliant balloon and proximally with a 4.0 x 12 noncompliant balloon. IVUS  to the RCA was not significant.  Her LDL at repeat from her cardiology visit was 89. Excellent.  Repatha.   EKG shows sinus rhythm, biphasic T-wave in V2 and V3.  [name] upper p.   04/20/17  - worried about coming off Brilinta.   01/16/18 -No new changes, no shortness of breath, no syncope, no bleeding. Cholesterol had decreased fairly well with Zetia and pravastatin 40, but she was having leg cramps on the 40 mg dose and decreased it to 20 mg.  Lipid clinic, [employer], Pharm.D. assisting.  She had been on injectables at one point but since she had such a good response to the statin therapy, we were holding off.  Overall other than some orthopedic issues, she is doing well.  She reminded me how cold it is in the [place] of [place].  01/16/18-sinus rhythm 84 with PVC personally viewed-prior unfortunately his wife got sick on the plane flight home.  02/27/2019 -Overall here for her follow-up of coronary artery disease hyperlipidemia.  She is been doing quite well.  No recent changes.  Unfortunately her husband had bronchoscopy for presumed lung cancer, recent pneumonia.  She has had no anginal symptoms no shortness of breath no fevers chills nausea vomiting syncope.  She brought in a list of her blood pressures which was reviewed extensively.  Overall excellent readings.  Denies any fevers chills nausea vomiting syncope bleeding  03/03/2020-here for the follow-up of coronary artery disease, hyperlipidemia, medications.  At previous visit, her husband had had a bronchoscopy for presumed lung cancer and recent pneumonia.  Blood pressures have been monitored.  Doing well.  No fevers chills nausea vomiting syncope bleeding.  No myalgias.  Past Medical History:  Diagnosis Date  . ALLERGIC RHINITIS   . Asthma   . Diabetes mellitus without complication (HCC)   . Hyperlipidemia   . Hypertension   . Osteoarthritis     No past surgical history on file.  Current Medications: Outpatient Medications Prior to Visit  Medication Sig Dispense Refill  . amitriptyline (ELAVIL) 10 MG tablet Take 10 mg by mouth at bedtime.     . calcium carbonate  (OS-CAL) 600 MG TABS Take 600 mg by mouth daily.      . carvedilol (COREG) 12.5 MG tablet Take 1 tablet (12.5 mg total) by mouth 2 (two) times daily. 180 tablet 2  . Cholecalciferol (VITAMIN D) 1000 UNITS capsule Take 2,000 Units by mouth daily.      . clopidogrel (PLAVIX) 75 MG tablet TAKE 1 TABLET BY MOUTH DAILY 90 tablet 3  . Coenzyme Q10 (COQ10) 100 MG CAPS Take 1 capsule by mouth daily.      Marland Kitchen ezetimibe (ZETIA) 10 MG tablet TAKE 1 TABLET BY MOUTH DAILY 90 tablet 0  . FARXIGA 5 MG TABS tablet Take 5 mg by mouth daily.    . fluticasone (FLONASE) 50 MCG/ACT nasal spray Place 1 spray into both nostrils daily as needed for allergies. As needed    . metFORMIN (GLUCOPHAGE) 500 MG tablet Take 500 mg by mouth daily.    . Multiple Vitamins-Minerals (MULTIVITAMIN PO) Take 1 tablet by mouth daily.     . nitroGLYCERIN (NITROSTAT) 0.4 MG SL tablet Place 0.4 mg under the tongue every 5 (five) minutes as needed for chest pain.    . pravastatin (PRAVACHOL) 20 MG tablet Take 1 tablet (20 mg total) by mouth every evening. 3 90 tablet 3  . RYBELSUS 7 MG TABS Take 1 tablet by mouth daily.    . valsartan (DIOVAN) 160 MG tablet Take 160 mg by mouth daily.    . vitamin C (ASCORBIC ACID) 500 MG tablet Take 500 mg by mouth daily.       No facility-administered medications prior to visit.     Allergies:   Symmetrel [amantadine], Crestor [rosuvastatin], and Singulair [montelukast]   Social History   Socioeconomic History  . Marital status: Married    Spouse name: Not on file  . Number of children: Not on file  . Years of education: Not on file  . Highest education level: Not on file  Occupational History  . 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 Smoking status: Never Smoker  . Smokeless tobacco: Never Used  Substance and Sexual Activity  . Alcohol use: Yes    Alcohol/week: 2.0 standard drinks    Types: 2 Glasses of wine per week    Comment: DRINKS TWICE A  MONTH   . Drug use: No  .  Sexual activity: Not on file  Other Topics Concern  . Not on file  Social History Narrative   Daughter is an Designer, industrial/product at [employer]   Financial Resource Strain:   . Difficulty of Paying Living Expenses:   Food Insecurity:   . Worried About Programme researcher, broadcasting/film/video in the Last Year:   . Barista in the Last Year:   Transportation Needs:   . Freight forwarder (Medical):   Marland Kitchen Lack of Transportation (Non-Medical):   Physical Activity:   . Days of Exercise per Week:   . Minutes of Exercise per Session:   Stress:   . Feeling of  Stress :   Social Connections:   . Frequency of Communication with Friends and Family:   . Frequency of Social Gatherings with Friends and Family:   . Attends Religious Services:   . Active Member of Clubs or Organizations:   . Attends Banker Meetings:   Marland Kitchen Marital Status:      Family History:  The patient's family history includes Heart attack in her father; Pneumonia in her mother.   ROS:   Please see the history of present illness.    Review of Systems  All other systems reviewed and are negative.     PHYSICAL EXAM:   VS:  BP 130/80   Pulse 73   Ht 5\' 7"  (1.702 m)   Wt 210 lb (95.3 kg)   LMP  (LMP Unknown)   BMI 32.89 kg/m    GEN: Well nourished, well developed, in no acute distress  HEENT: normal  Neck: no JVD, carotid bruits, or masses Cardiac: RRR; no murmurs, rubs, or gallops,no edema  Respiratory:  clear to auscultation bilaterally, normal work of breathing GI: soft, nontender, nondistended, + BS MS: no deformity or atrophy  Skin: warm and dry, no rash Neuro:  Alert and Oriented x 3, Strength and sensation are intact Psych: euthymic mood, full affect     Wt Readings from Last 3 Encounters:  03/03/20 210 lb (95.3 kg)  02/27/19 213 lb (96.6 kg)  01/16/18 225 lb 9.6 oz (102.3 kg)      Studies/Labs Reviewed:   EKG:  EKG is ordered today.  03/03/20 - NSR 73  02/27/2019-normal sinus rhythm 67 with left axis deviation poor R wave progression no significant change from others personally reviewed and interpreted.  01/16/18- overall sinus rhythm no other significant abnormalities.  Personally viewed 11/16/16-sinus rhythm, 74, no other abnormalities. T-wave inversions are no longer seen on EKG in V2 and V3.  Recent Labs: No results found for requested labs within last 8760 hours.   Lipid Panel    Component Value Date/Time   CHOL 157 02/15/2017 0903   TRIG 162 (H) 02/15/2017 0903   HDL 50 02/15/2017 0903   CHOLHDL 3.1 02/15/2017 0903   LDLCALC 75 02/15/2017 0903    Additional studies/ records that were reviewed today include:  Prior office notes reviewed, lab work reviewed, ECGs reviewed    ASSESSMENT:    1. Coronary artery disease of native artery of native heart with stable angina pectoris (HCC)   2. Familial hyperlipidemia   3. Pure hypercholesterolemia      PLAN:  In order of problems listed above:  Coronary artery disease  - Proximal LAD stent 06/07/16  - This was after abnormal nuclear stress test and exertional anginal symptoms.  - Aggressive secondary prevention. She is asymptomatic. Doing well. Enjoys travel, unfortunate this was stifled during Covid.  -Currently on Plavix monotherapy.  No anginal symptoms currently.  Doing well.  -We will continue with current plan.  No signs of any angina.  Continue with current plan.  Familial hyperlipidemia  - Zetia and Pravastatin 40 - LDL 75 from 217. Could not continue 40 so decreased to 20. 40 dose was better than when she was on Repatha.  Last LDL in October 16, 2019 was 85 HDL 54 total cholesterol 170.  Hemoglobin A1c 6.6 in February 2021 from outside labs.  Hemoglobin 15.4.  Creatinine 0.9.  - She was taking PCSK9 Repatha. Her doctor in [name] was giving her this medication samples  -Has discussed with [facility]  Supple in the past.  For now, continue with Zetia and pravastatin.    Obesity  - Encourage weight loss. She saw a nutritionist when she was visiting her daughter. Excellent. Continue to exercise. Her knee was "fixed" by her son-in-law and she is doing better.  She is eating healthier because her husband has pseudogout.  Overall she has done quite a good job about weight loss overall.  We discussed.  Weight is down see above.  Continue to exercise, eat well.  Very good job with weight loss over the past 2 years.  Continue.   Essential hypertension  - Goal blood pressure less than 130/80. Doing much better.  Previously increased carvedilol to 12.5 mg twice a day.  I like her results.  She is continuing to monitor at home.  Doing well at home.  Monitoring daily.  Just like her husband.    Medication Adjustments/Labs and Tests Ordered: Current medicines are reviewed at length with the patient today.  Concerns regarding medicines are outlined above.  Medication changes, Labs and Tests ordered today are listed in the Patient Instructions below. Patient Instructions  Medication Instructions:   Your physician recommends that you continue on your current medications as directed. Please refer to the Current Medication list given to you today.  *If you need a refill on your cardiac medications before your next appointment, please call your pharmacy*  Follow-Up: At [facility], you and your health needs are our priority.  As part of our continuing mission to provide you with exceptional heart care, we have created designated Provider Care Teams.  These Care Teams include your primary Cardiologist (physician) and Advanced Practice Providers (APPs -  Physician Assistants and Nurse Practitioners) who all work together to provide you with the care you need, when you need it.  We recommend signing up for the patient portal called "MyChart".  Sign up information is provided on this After Visit Summary.  MyChart is used to connect with patients for Virtual Visits (Telemedicine).   Patients are able to view lab/test results, encounter notes, upcoming appointments, etc.  Non-urgent messages can be sent to your provider as well.   To learn more about what you can do with MyChart, go to [email or web address].    Your next appointment:   12 month(s)  The format for your next appointment:   In Person  Provider:   Candee Furbish, MD        Signed, Candee Furbish, MD  03/03/2020 10:23 AM    [address] [place], [place],   [address] Phone: [phone]; Fax: [phone]

## 2020-04-21 ENCOUNTER — Other Ambulatory Visit: Payer: Self-pay | Admitting: Cardiology

## 2020-05-14 ENCOUNTER — Other Ambulatory Visit: Payer: Self-pay | Admitting: Cardiology

## 2020-08-07 ENCOUNTER — Other Ambulatory Visit: Payer: Self-pay | Admitting: Cardiology

## 2020-12-03 ENCOUNTER — Other Ambulatory Visit: Payer: Self-pay | Admitting: Family Medicine

## 2020-12-03 DIAGNOSIS — Z1231 Encounter for screening mammogram for malignant neoplasm of breast: Secondary | ICD-10-CM

## 2021-01-14 ENCOUNTER — Other Ambulatory Visit: Payer: Self-pay

## 2021-01-14 ENCOUNTER — Ambulatory Visit
Admission: RE | Admit: 2021-01-14 | Discharge: 2021-01-14 | Disposition: A | Discharge status: Home / Self Care | Payer: Medicare Other | Source: Ambulatory Visit | Attending: Family Medicine | Admitting: Family Medicine

## 2021-01-14 DIAGNOSIS — Z1231 Encounter for screening mammogram for malignant neoplasm of breast: Secondary | ICD-10-CM

## 2021-02-26 ENCOUNTER — Encounter: Payer: Self-pay | Admitting: Physical Therapy

## 2021-02-26 ENCOUNTER — Ambulatory Visit: Payer: Medicare Other | Attending: Sports Medicine | Admitting: Physical Therapy

## 2021-02-26 ENCOUNTER — Other Ambulatory Visit: Payer: Self-pay

## 2021-02-26 DIAGNOSIS — M79661 Pain in right lower leg: Secondary | ICD-10-CM | POA: Diagnosis present

## 2021-02-26 DIAGNOSIS — M545 Low back pain, unspecified: Secondary | ICD-10-CM | POA: Diagnosis present

## 2021-02-26 DIAGNOSIS — R262 Difficulty in walking, not elsewhere classified: Secondary | ICD-10-CM | POA: Diagnosis present

## 2021-02-26 DIAGNOSIS — G8929 Other chronic pain: Secondary | ICD-10-CM | POA: Diagnosis present
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
## 2021-03-04 ENCOUNTER — Encounter: Payer: Self-pay | Admitting: Physical Therapy

## 2021-03-04 ENCOUNTER — Ambulatory Visit: Payer: Medicare Other | Admitting: Physical Therapy

## 2021-03-04 ENCOUNTER — Other Ambulatory Visit: Payer: Self-pay

## 2021-03-04 DIAGNOSIS — M79661 Pain in right lower leg: Secondary | ICD-10-CM

## 2021-03-04 DIAGNOSIS — G8929 Other chronic pain: Secondary | ICD-10-CM

## 2021-03-04 DIAGNOSIS — R262 Difficulty in walking, not elsewhere classified: Secondary | ICD-10-CM

## 2021-03-04 DIAGNOSIS — M545 Low back pain, unspecified: Secondary | ICD-10-CM
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
## 2021-03-09 ENCOUNTER — Encounter: Payer: Self-pay | Admitting: Physical Therapy

## 2021-03-09 ENCOUNTER — Other Ambulatory Visit: Payer: Self-pay

## 2021-03-09 ENCOUNTER — Ambulatory Visit: Payer: Medicare Other | Admitting: Physical Therapy

## 2021-03-09 DIAGNOSIS — M545 Low back pain, unspecified: Secondary | ICD-10-CM

## 2021-03-09 DIAGNOSIS — M79661 Pain in right lower leg: Secondary | ICD-10-CM

## 2021-03-09 DIAGNOSIS — R262 Difficulty in walking, not elsewhere classified: Secondary | ICD-10-CM

## 2021-03-09 DIAGNOSIS — G8929 Other chronic pain: Secondary | ICD-10-CM

## 2021-03-09 NOTE — Therapy (Signed)
[facility] [facility] [address] [place], [name], [address] Phone: [phone]   Fax:  [phone]  Physical Therapy Treatment  Patient Details  Name: Janice Mcdonald MRN: [id number] Date of Birth: 10/29/1941 Referring Provider (PT): Quenten Raven Date: 03/09/2021   PT End of Session - 03/09/21 1146    Visit Number 3    Date for PT Re-Evaluation 04/28/21    PT Start Time 1100    PT Stop Time 1143    PT Time Calculation (min) 43 min    Activity Tolerance Patient tolerated treatment well    Behavior During Therapy [facility] for tasks assessed/performed           Past Medical History:  Diagnosis Date  . ALLERGIC RHINITIS   . Asthma   . Diabetes mellitus without complication (HCC)   . Hyperlipidemia   . Hypertension   . Osteoarthritis     History reviewed. No pertinent surgical history.  There were no vitals filed for this visit.   Subjective Assessment - 03/09/21 1104    Subjective Pt reports that she felt much better after last session    Currently in Pain? Yes    Pain Score 2     Pain Location Back                             [facility] Adult PT Treatment/Exercise - 03/09/21 0001      Lumbar Exercises: Stretches   Active Hamstring Stretch Right;Left;2 reps;20 seconds    Active Hamstring Stretch Limitations seated    Piriformis Stretch Right;Left;2 reps;20 seconds    Piriformis Stretch Limitations seated    Gastroc Stretch Right;Left;1 rep;20 seconds      Lumbar Exercises: Aerobic   UBE (Upper Arm Bike) L2 x2 min each    Nustep L3 x 6 min      Lumbar Exercises: Standing   Heel Raises 15 reps    Row Both;20 reps;Theraband    Theraband Level (Row) Level 3 (Green)    Shoulder Extension Theraband;20 reps;Both;Strengthening    Theraband Level (Shoulder Extension) Level 3 (Green)      Lumbar Exercises: Seated   Long Arc Quad on Chair 2 sets;Both;10 reps    LAQ on Chair Limitations 2.5#     Sit to Stand 20 reps   10x2 with 2# dumbbell chest press and OHP   Other Seated Lumbar Exercises ab sets x15 with pball 3 sec hold, ball squeezes x15, red TB seated clamshell x15    Other Seated Lumbar Exercises seated marches 2x10 2.5#, HS curls red TB 2x10                    PT Short Term Goals - 03/04/21 4431      PT SHORT TERM GOAL #1   Title Pt will be I with initial HEP    Status Achieved             PT Long Term Goals - 02/26/21 0929      PT LONG TERM GOAL #1   Title Pt will be I with advanced HEP    Time 6    Period Weeks    Status New    Target Date 04/09/21      PT LONG TERM GOAL #2   Title Pt will report resolution of R leg pain    Time 6    Period Weeks  Status New    Target Date 04/09/21      PT LONG TERM GOAL #3   Title Pt will demo able to climb flight of stairs with HR and no increase in R leg pain    Time 6    Period Weeks    Status New    Target Date 04/09/21                 Plan - 03/09/21 1146    Clinical Impression Statement Pt tolerated progression of TE well with no c/o of increased LBP during treatment. Pt states that she has mild L knee pain but no L calf pain this rx. Cues for form with standing shoulder extensions. Cues for core activation with seated marching and pball iso abs. Reports relief of LBP with exercises and stretching.    PT Treatment/Interventions ADLs/Self Care Home Management;Electrical Stimulation;Iontophoresis 4mg /ml Dexamethasone;Moist Heat;Neuromuscular re-education;Balance training;Therapeutic exercise;Therapeutic activities;Functional mobility training;Stair training;Gait training;Patient/family education;Manual techniques;Passive range of motion    PT Next Visit Plan functional LE strengthening, manual/flexibility to B hamstrings, stair progression    Consulted and Agree with Plan of Care Patient           Patient will benefit from skilled therapeutic intervention in order to improve the following  deficits and impairments:  Abnormal gait,Decreased range of motion,Difficulty walking,Increased muscle spasms,Decreased activity tolerance,Pain,Impaired flexibility,Hypomobility,Decreased strength  Visit Diagnosis: Chronic bilateral low back pain without sciatica  Difficulty in walking, not elsewhere classified  Pain in right lower leg     Problem List Patient Active Problem List   Diagnosis Date Noted  . Hyperlipidemia 11/19/2016  . Coronary artery disease of native artery of native heart with stable angina pectoris (HCC) 06/02/2016  . Pure hypercholesterolemia 06/02/2016  . 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 ALLERGIC RHINITIS 04/03/2008  . ASTHMA 04/03/2008   04/05/2008, PT, DPT [name] [name] 03/09/2021, 11:48 AM  [facility] [address] [place], [place], [name] Phone: [phone]   Fax:  [phone]  Name: Janice Mcdonald MRN: [name] Date of Birth: 1941-06-14

## 2021-03-11 ENCOUNTER — Other Ambulatory Visit: Payer: Self-pay

## 2021-03-11 ENCOUNTER — Encounter: Payer: Self-pay | Admitting: Physical Therapy

## 2021-03-11 ENCOUNTER — Ambulatory Visit: Payer: Medicare Other | Admitting: Physical Therapy

## 2021-03-11 DIAGNOSIS — M79661 Pain in right lower leg: Secondary | ICD-10-CM

## 2021-03-11 DIAGNOSIS — R262 Difficulty in walking, not elsewhere classified: Secondary | ICD-10-CM

## 2021-03-11 DIAGNOSIS — G8929 Other chronic pain: Secondary | ICD-10-CM

## 2021-03-11 NOTE — Therapy (Signed)
[facility] [place] [address]. [place], [name], [address] Phone: [phone]   Fax:  [phone]  Physical Therapy Treatment  Patient Details  Name: JAYLE SOLARZ MRN: [id number] Date of Birth: [birth year] Referring Provider (PT): Quenten Raven Date: 03/11/2021   PT End of Session - 03/11/21 0934    Visit Number 4    Date for PT Re-Evaluation 04/28/21    PT Start Time 0842    PT Stop Time 0927    PT Time Calculation (min) 45 min    Activity Tolerance Patient tolerated treatment well    Behavior During Therapy [facility] for tasks assessed/performed           Past Medical History:  Diagnosis Date  . ALLERGIC RHINITIS   . Asthma   . Diabetes mellitus without complication (HCC)   . Hyperlipidemia   . Hypertension   . Osteoarthritis     History reviewed. No pertinent surgical history.  There were no vitals filed for this visit.   Subjective Assessment - 03/11/21 0836    Subjective Pt reports she is feeling better, no pain today.    Currently in Pain? Yes    Pain Score 0-No pain    Pain Location Back                             [facility] Adult PT Treatment/Exercise - 03/11/21 0001      Lumbar Exercises: Stretches   Active Hamstring Stretch Right;Left;2 reps;20 seconds    Active Hamstring Stretch Limitations seated    Piriformis Stretch Right;Left;2 reps;20 seconds    Piriformis Stretch Limitations seated    Gastroc Stretch Right;Left;1 rep;20 seconds      Lumbar Exercises: Aerobic   UBE (Upper Arm Bike) L2 x2 min each    Nustep L5 x 6 min      Lumbar Exercises: Machines for Strengthening   Other Lumbar Machine Exercise 20# 2x10 rows and lats      Lumbar Exercises: Standing   Heel Raises 15 reps    Shoulder Extension Both;20 reps   2x10   Shoulder Extension Limitations 5#      Lumbar Exercises: Seated   Long Arc Quad on Chair 2 sets;Both;10 reps    LAQ on Chair Limitations 2.5#    Sit  to Stand 20 reps   10x2 with 2# dumbbell chest press and OHP   Other Seated Lumbar Exercises ball squeeze x15 3 sec hold, red Tb seated clamshell x15    Other Seated Lumbar Exercises seated marches 2x10 2.5#, HS curls red TB 2x10                    PT Short Term Goals - 03/04/21 7564      PT SHORT TERM GOAL #1   Title Pt will be I with initial HEP    Status Achieved             PT Long Term Goals - 02/26/21 0929      PT LONG TERM GOAL #1   Title Pt will be I with advanced HEP    Time 6    Period Weeks    Status New    Target Date 04/09/21      PT LONG TERM GOAL #2   Title Pt will report resolution of R leg pain    Time 6    Period Weeks    Status  New    Target Date 04/09/21      PT LONG TERM GOAL #3   Title Pt will demo able to climb flight of stairs with HR and no increase in R leg pain    Time 6    Period Weeks    Status New    Target Date 04/09/21                 Plan - 03/11/21 0935    Clinical Impression Statement Pt tolerated progression to machine interventions well with no c/o of increased LBP with exercise. Requires cues for posture/form with standing shoulder extensions and seated rows. Reports relief of LBP with seated LE stretches. Continue to progress functional strengthening and flexibility. Begin stair progression to assist pt with accessing cottage in [place].    PT Treatment/Interventions ADLs/Self Care Home Management;Electrical Stimulation;Iontophoresis 4mg /ml Dexamethasone;Moist Heat;Neuromuscular re-education;Balance training;Therapeutic exercise;Therapeutic activities;Functional mobility training;Stair training;Gait training;Patient/family education;Manual techniques;Passive range of motion    PT Next Visit Plan functional LE strengthening, manual/flexibility to B hamstrings, stair progression    Consulted and Agree with Plan of Care Patient           Patient will benefit from skilled therapeutic intervention in order to  improve the following deficits and impairments:  Abnormal gait,Decreased range of motion,Difficulty walking,Increased muscle spasms,Decreased activity tolerance,Pain,Impaired flexibility,Hypomobility,Decreased strength  Visit Diagnosis: Difficulty in walking, not elsewhere classified  Chronic bilateral low back pain without sciatica  Pain in right lower leg     Problem List Patient Active Problem List   Diagnosis Date Noted  . Hyperlipidemia 11/19/2016  . Coronary artery disease of native artery of native heart with stable angina pectoris (HCC) 06/02/2016  . Pure hypercholesterolemia 06/02/2016  . 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 ALLERGIC RHINITIS 04/03/2008  . ASTHMA 04/03/2008   04/05/2008, PT, DPT [name] Velmer Broadfoot 03/11/2021, 9:37 AM  [facility] [place] [facility] [address] [place], [place], [name] Phone: [phone]   Fax:  [phone]  Name: ELANOR CALE MRN: [name] Date of Birth: August 06, 1941

## 2021-03-17 ENCOUNTER — Ambulatory Visit (INDEPENDENT_AMBULATORY_CARE_PROVIDER_SITE_OTHER): Payer: Medicare Other | Admitting: Cardiology

## 2021-03-17 ENCOUNTER — Encounter: Payer: Self-pay | Admitting: Cardiology

## 2021-03-17 ENCOUNTER — Other Ambulatory Visit: Payer: Self-pay

## 2021-03-17 VITALS — BP 124/80 | HR 61 | Ht 67.0 in | Wt 218.0 lb

## 2021-03-17 DIAGNOSIS — I25118 Atherosclerotic heart disease of native coronary artery with other forms of angina pectoris: Secondary | ICD-10-CM | POA: Diagnosis not present

## 2021-03-17 DIAGNOSIS — E7849 Other hyperlipidemia: Secondary | ICD-10-CM
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
## 2021-03-17 NOTE — Patient Instructions (Signed)
Medication Instructions:  The current medical regimen is effective;  continue present plan and medications.  *If you need a refill on your cardiac medications before your next appointment, please call your pharmacy*  Follow-Up: At [facility], you and your health needs are our priority.  As part of our continuing mission to provide you with exceptional heart care, we have created designated Provider Care Teams.  These Care Teams include your primary Cardiologist (physician) and Advanced Practice Providers (APPs -  Physician Assistants and Nurse Practitioners) who all work together to provide you with the care you need, when you need it.  We recommend signing up for the patient portal called "MyChart".  Sign up information is provided on this After Visit Summary.  MyChart is used to connect with patients for Virtual Visits (Telemedicine).  Patients are able to view lab/test results, encounter notes, upcoming appointments, etc.  Non-urgent messages can be sent to your provider as well.   To learn more about what you can do with MyChart, go to [email or web address]://www.mychart.com.    Your next appointment:   12 month(s)  The format for your next appointment:   In Person  Provider:   Mark Skains, MD   Thank you for choosing [facility]!!      

## 2021-03-18 ENCOUNTER — Encounter: Payer: Self-pay | Admitting: Physical Therapy

## 2021-03-18 ENCOUNTER — Ambulatory Visit: Payer: Medicare Other | Admitting: Physical Therapy

## 2021-03-18 DIAGNOSIS — M79661 Pain in right lower leg: Secondary | ICD-10-CM | POA: Diagnosis not present

## 2021-03-18 DIAGNOSIS — R262 Difficulty in walking, not elsewhere classified: Secondary | ICD-10-CM

## 2021-03-18 DIAGNOSIS — M545 Low back pain, unspecified: Secondary | ICD-10-CM

## 2021-03-18 NOTE — Therapy (Signed)
[facility] [address] [place], [name], [address] Phone: [phone]   Fax:  [phone]  Physical Therapy Treatment  Patient Details  Name: Janice Mcdonald MRN: [id number] Date of Birth: 01/13/1941 Referring Provider (PT): Quenten Raven Date: 03/18/2021   PT End of Session - 03/18/21 0925    Visit Number 5    Date for PT Re-Evaluation 04/28/21    PT Start Time 0844    PT Stop Time 0928    PT Time Calculation (min) 44 min    Activity Tolerance Patient tolerated treatment well    Behavior During Therapy [facility] for tasks assessed/performed           Past Medical History:  Diagnosis Date  . ALLERGIC RHINITIS   . Asthma   . Diabetes mellitus without complication (HCC)   . Hyperlipidemia   . Hypertension   . Osteoarthritis     History reviewed. No pertinent surgical history.  There were no vitals filed for this visit.   Subjective Assessment - 03/18/21 0845    Subjective Doing ok overall.    Currently in Pain? No/denies                             [facility] Adult PT Treatment/Exercise - 03/18/21 0001      Ambulation/Gait   Stairs Yes    Stairs Assistance 5: Supervision    Stair Management Technique No rails;Alternating pattern    Number of Stairs 10    Height of Stairs 4   6''     Exercises   Exercises Knee/Hip      Lumbar Exercises: Aerobic   UBE (Upper Arm Bike) L2 x 1.5 min    Nustep L5 x 6 min      Lumbar Exercises: Machines for Strengthening   Cybex Knee Flexion 25lb 2x10      Lumbar Exercises: Standing   Heel Raises 2 seconds;20 reps    Other Standing Lumbar Exercises 30lb 2x10    Other Standing Lumbar Exercises Lateral step ups 6 in x10      Lumbar Exercises: Seated   Sit to Stand 20 reps   holding yellow ball   Other Seated Lumbar Exercises ball squeeze x15 3 sec hold, red Tb seated clamshell x15      Knee/Hip Exercises: Standing   Forward Step Up Both;1 set;10  reps                    PT Short Term Goals - 03/04/21 0928      PT SHORT TERM GOAL #1   Title Pt will be I with initial HEP    Status Achieved             PT Long Term Goals - 03/18/21 0851      PT LONG TERM GOAL #3   Title Pt will demo able to climb flight of stairs with HR and no increase in R leg pain    Status Achieved                 Plan - 03/18/21 0925    Clinical Impression Statement Pt did well with  a progressed treatment. She was able to tolerated more intense intervention without pain. Cues needed to control the eccentric phase of resisted gait and with sit to stands. Increase fatigue noted with step ups. No issues with stair negotiation without and rails.  Personal Factors and Comorbidities Comorbidity 2    Comorbidities HTN, DM    Examination-Activity Limitations Stand;Stairs;Locomotion Level    Examination-Participation Restrictions Yard Work;Community Activity;Interpersonal Relationship    Stability/Clinical Decision Making Stable/Uncomplicated    Rehab Potential Good    PT Frequency 2x / week    PT Duration 6 weeks    PT Treatment/Interventions ADLs/Self Care Home Management;Electrical Stimulation;Iontophoresis 4mg /ml Dexamethasone;Moist Heat;Neuromuscular re-education;Balance training;Therapeutic exercise;Therapeutic activities;Functional mobility training;Stair training;Gait training;Patient/family education;Manual techniques;Passive range of motion           Patient will benefit from skilled therapeutic intervention in order to improve the following deficits and impairments:  Abnormal gait,Decreased range of motion,Difficulty walking,Increased muscle spasms,Decreased activity tolerance,Pain,Impaired flexibility,Hypomobility,Decreased strength  Visit Diagnosis: Pain in right lower leg  Difficulty in walking, not elsewhere classified  Chronic bilateral low back pain without sciatica     Problem List Patient Active Problem List    Diagnosis Date Noted  . Hyperlipidemia 11/19/2016  . Coronary artery disease of native artery of native heart with stable angina pectoris (HCC) 06/02/2016  . Pure hypercholesterolemia 06/02/2016  . 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 ALLERGIC RHINITIS 04/03/2008  . ASTHMA 04/03/2008    04/05/2008 03/18/2021, 9:27 AM  [facility] [address] [place], [place], [name] Phone: [phone]   Fax:  [phone]  Name: Janice Mcdonald MRN: [name] Date of Birth: March 18, 1941

## 2021-03-19 ENCOUNTER — Ambulatory Visit: Payer: Medicare Other | Admitting: Physical Therapy

## 2021-03-24 ENCOUNTER — Ambulatory Visit: Payer: Medicare Other | Attending: Sports Medicine | Admitting: Physical Therapy

## 2021-03-24 ENCOUNTER — Encounter: Payer: Self-pay | Admitting: Physical Therapy

## 2021-03-24 ENCOUNTER — Other Ambulatory Visit: Payer: Self-pay

## 2021-03-24 DIAGNOSIS — R262 Difficulty in walking, not elsewhere classified: Secondary | ICD-10-CM | POA: Insufficient documentation

## 2021-03-24 DIAGNOSIS — M79661 Pain in right lower leg: Secondary | ICD-10-CM | POA: Insufficient documentation

## 2021-03-24 DIAGNOSIS — G8929 Other chronic pain: Secondary | ICD-10-CM | POA: Insufficient documentation

## 2021-03-24 DIAGNOSIS — M545 Low back pain, unspecified: Secondary | ICD-10-CM | POA: Insufficient documentation

## 2021-03-24 NOTE — Therapy (Signed)
[place]. [place], [place], [address] Phone: [phone]   Fax:  [phone]  Physical Therapy Treatment  Patient Details  Name: Janice Mcdonald MRN: [id number] Date of Birth: 1941-04-22 Referring Provider (PT): Sherryle Lis Date: 03/24/2021   PT End of Session - 03/24/21 0927    Visit Number 6    Date for PT Re-Evaluation 04/28/21    PT Start Time 0840    PT Stop Time 0926    PT Time Calculation (min) 46 min    Activity Tolerance Patient tolerated treatment well    Behavior During Therapy [facility] for tasks assessed/performed           Past Medical History:  Diagnosis Date  . ALLERGIC RHINITIS   . Asthma   . Diabetes mellitus without complication ([place])   . Hyperlipidemia   . Hypertension   . Osteoarthritis     History reviewed. No pertinent surgical history.  There were no vitals filed for this visit.   Subjective Assessment - 03/24/21 0846    Subjective Pt reports no new changes. States was able to walk for 12 minutes with no pain/problems.    Currently in Pain? Yes    Pain Score 1     Pain Location Back                             [facility] Adult PT Treatment/Exercise - 03/24/21 0001      Lumbar Exercises: Stretches   Gastroc Stretch Right;Left;1 rep;20 seconds      Lumbar Exercises: Aerobic   UBE (Upper Arm Bike) L2 x 2 min each    Nustep L5 x 6 min      Lumbar Exercises: Machines for Strengthening   Cybex Knee Extension 5# 2x10    Cybex Knee Flexion 25lb 2x10    Other Lumbar Machine Exercise 20# 2x10 rows and lats      Lumbar Exercises: Standing   Heel Raises 15 reps    Other Standing Lumbar Exercises resisted gait 30# x4 each direction    Other Standing Lumbar Exercises standing hip abd/ext red TB x10 B; fwd/lat step ups 6" x10      Lumbar Exercises: Seated   Sit to Stand 20 reps   2x10 with yellow ball chest press and OHP   Other Seated Lumbar Exercises  seated iso abs with exercise ball x15 3 sec hold                    PT Short Term Goals - 03/04/21 4580      PT SHORT TERM GOAL #1   Title Pt will be I with initial HEP    Status Achieved             PT Long Term Goals - 03/24/21 0929      PT LONG TERM GOAL #1   Title Pt will be I with advanced HEP    Status On-going      PT LONG TERM GOAL #2   Title Pt will report resolution of R leg pain    Status Partially Met      PT LONG TERM GOAL #3   Title Pt will demo able to climb flight of stairs with HR and no increase in R leg pain    Status Achieved  Plan - 03/24/21 0928    Clinical Impression Statement Pt tolerated progression of TE with no c/o of increased pain. Does demo hip compensations with standing hip abd/ext with TB esp on LLE. Cues for even step length with bkwds walking resisted gait. Occasional LOB with sidestepping requiring PT correction. Continue to progress functional LE strengthening.    PT Treatment/Interventions ADLs/Self Care Home Management;Electrical Stimulation;Iontophoresis 61m/ml Dexamethasone;Moist Heat;Neuromuscular re-education;Balance training;Therapeutic exercise;Therapeutic activities;Functional mobility training;Stair training;Gait training;Patient/family education;Manual techniques;Passive range of motion    PT Next Visit Plan functional LE strengthening, manual/flexibility to B hamstrings, stair progression    Consulted and Agree with Plan of Care Patient           Patient will benefit from skilled therapeutic intervention in order to improve the following deficits and impairments:  Abnormal gait,Decreased range of motion,Difficulty walking,Increased muscle spasms,Decreased activity tolerance,Pain,Impaired flexibility,Hypomobility,Decreased strength  Visit Diagnosis: Pain in right lower leg  Difficulty in walking, not elsewhere classified  Chronic bilateral low back pain without sciatica     Problem  List Patient Active Problem List   Diagnosis Date Noted  . Hyperlipidemia 11/19/2016  . Coronary artery disease of native artery of native heart with stable angina pectoris ([place] 06/02/2016  . Pure hypercholesterolemia 06/02/2016  . 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 ALLERGIC RHINITIS 04/03/2008  . ASTHMA 04/03/2008   AAmador Cunas PT, DPT ADonald ProseSugg 03/24/2021, 9:29 AM  [place] [address]Phone: [phone]  Fax:  [phone] Name: KALLYSIA INGLESMRN: [id number] of Birth: [birth year]

## 2021-03-26 ENCOUNTER — Ambulatory Visit: Payer: Medicare Other | Admitting: Physical Therapy

## 2021-03-26 ENCOUNTER — Encounter: Payer: Self-pay | Admitting: Physical Therapy

## 2021-03-26 ENCOUNTER — Other Ambulatory Visit: Payer: Self-pay

## 2021-03-26 DIAGNOSIS — M79661 Pain in right lower leg: Secondary | ICD-10-CM

## 2021-03-26 DIAGNOSIS — G8929 Other chronic pain: Secondary | ICD-10-CM

## 2021-03-26 DIAGNOSIS — R262 Difficulty in walking, not elsewhere classified: Secondary | ICD-10-CM

## 2021-03-26 NOTE — Therapy (Signed)
[place]. [place], [place], [address] Phone: [phone]   Fax:  [phone]  Physical Therapy Treatment  Patient Details  Name: Janice Mcdonald MRN: [id number] Date of Birth: [birth year] Referring Provider (PT): Sherryle Lis Date: 03/26/2021   PT End of Session - 03/26/21 1145    Visit Number 7    Date for PT Re-Evaluation 04/28/21    PT Start Time 1055    PT Stop Time 1136    PT Time Calculation (min) 41 min    Activity Tolerance Patient tolerated treatment well    Behavior During Therapy [facility] for tasks assessed/performed           Past Medical History:  Diagnosis Date  . ALLERGIC RHINITIS   . Asthma   . Diabetes mellitus without complication ([place])   . Hyperlipidemia   . Hypertension   . Osteoarthritis     History reviewed. No pertinent surgical history.  There were no vitals filed for this visit.   Subjective Assessment - 03/26/21 1055    Subjective Pt reports a little increased soreness in legs following workout last session. Feeling better today.    Currently in Pain? Yes    Pain Score 1     Pain Location Back    Pain Score 2    Pain Location Leg    Pain Orientation Lower                             [facility] Adult PT Treatment/Exercise - 03/26/21 0001      Lumbar Exercises: Aerobic   UBE (Upper Arm Bike) L2 x 2 min each    Nustep L5 x 6 min      Lumbar Exercises: Machines for Strengthening   Cybex Knee Extension 5# 2x10    Cybex Knee Flexion 25lb 2x10    Other Lumbar Machine Exercise 20# 2x10 rows and lats      Lumbar Exercises: Standing   Other Standing Lumbar Exercises resisted gait 30# x4 each direction      Lumbar Exercises: Seated   Sit to Stand 20 reps   yellow ball chest press and OHP   Other Seated Lumbar Exercises seated iso abs with exercise ball x15 3 sec hold      Knee/Hip Exercises: Standing   Forward Step Up Both;1 set;10 reps    Other  Standing Knee Exercises alt step taps 6" stair x10 B no HR                    PT Short Term Goals - 03/04/21 0928      PT SHORT TERM GOAL #1   Title Pt will be I with initial HEP    Status Achieved             PT Long Term Goals - 03/24/21 0929      PT LONG TERM GOAL #1   Title Pt will be I with advanced HEP    Status On-going      PT LONG TERM GOAL #2   Title Pt will report resolution of R leg pain    Status Partially Met      PT LONG TERM GOAL #3   Title Pt will demo able to climb flight of stairs with HR and no increase in R leg pain    Status Achieved  Plan - 03/26/21 1145    Clinical Impression Statement Pt demos progress toward LTG. Significant improvement of FOTO score from time of eval. Does still demo widened BOS with decreased stance time RLE. No LOB with resisted gait today and increased stability with sidestepping. Continue to progress functional LE strengthening.    PT Treatment/Interventions ADLs/Self Care Home Management;Electrical Stimulation;Iontophoresis 51m/ml Dexamethasone;Moist Heat;Neuromuscular re-education;Balance training;Therapeutic exercise;Therapeutic activities;Functional mobility training;Stair training;Gait training;Patient/family education;Manual techniques;Passive range of motion    PT Next Visit Plan functional LE strengthening, manual/flexibility to B hamstrings, stair progression    Consulted and Agree with Plan of Care Patient           Patient will benefit from skilled therapeutic intervention in order to improve the following deficits and impairments:  Abnormal gait,Decreased range of motion,Difficulty walking,Increased muscle spasms,Decreased activity tolerance,Pain,Impaired flexibility,Hypomobility,Decreased strength  Visit Diagnosis: Pain in right lower leg  Difficulty in walking, not elsewhere classified  Chronic bilateral low back pain without sciatica     Problem List Patient Active  Problem List   Diagnosis Date Noted  . Hyperlipidemia 11/19/2016  . Coronary artery disease of native artery of native heart with stable angina pectoris ([place] 06/02/2016  . Pure hypercholesterolemia 06/02/2016  . 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 ALLERGIC RHINITIS 04/03/2008  . ASTHMA 04/03/2008   AAmador Cunas PT, DPT ADonald ProseSugg 03/26/2021, 11:49 AM  [place] [address]Phone: [phone]  Fax:  [phone] Name: Janice Mcdonald: [id number] of Birth: [birth year]

## 2021-03-31 ENCOUNTER — Other Ambulatory Visit: Payer: Self-pay

## 2021-03-31 ENCOUNTER — Ambulatory Visit: Payer: Medicare Other | Admitting: Physical Therapy

## 2021-03-31 ENCOUNTER — Encounter: Payer: Self-pay | Admitting: Physical Therapy

## 2021-03-31 DIAGNOSIS — M545 Low back pain, unspecified: Secondary | ICD-10-CM

## 2021-03-31 DIAGNOSIS — M79661 Pain in right lower leg: Secondary | ICD-10-CM | POA: Diagnosis not present

## 2021-03-31 DIAGNOSIS — G8929 Other chronic pain: Secondary | ICD-10-CM

## 2021-03-31 DIAGNOSIS — R262 Difficulty in walking, not elsewhere classified: Secondary | ICD-10-CM

## 2021-03-31 NOTE — Therapy (Signed)
[place]. [place], [place], [address] Phone: [phone]   Fax:  [phone]  Physical Therapy Treatment  Patient Details  Name: Janice Mcdonald MRN: [id number] Date of Birth: [birth year] Referring Provider (PT): Sherryle Lis Date: 03/31/2021   PT End of Session - 03/31/21 1141    Visit Number 8    Date for PT Re-Evaluation 04/28/21    PT Start Time 1100    PT Stop Time 1141    PT Time Calculation (min) 41 min    Activity Tolerance Patient tolerated treatment well    Behavior During Therapy [facility] for tasks assessed/performed           Past Medical History:  Diagnosis Date  . ALLERGIC RHINITIS   . Asthma   . Diabetes mellitus without complication ([place])   . Hyperlipidemia   . Hypertension   . Osteoarthritis     History reviewed. No pertinent surgical history.  There were no vitals filed for this visit.   Subjective Assessment - 03/31/21 1101    Subjective "Pretty good"    Currently in Pain? Yes    Pain Score 2     Pain Location --   L knee and back                            [facility] Adult PT Treatment/Exercise - 03/31/21 0001      Lumbar Exercises: Aerobic   UBE (Upper Arm Bike) L2 x 2 min each    Recumbent Bike L2 x 5 min      Lumbar Exercises: Machines for Strengthening   Cybex Knee Extension 10# 2x10    Cybex Knee Flexion 25lb 2x15    Other Lumbar Machine Exercise 25# 2x10 rows and lats      Lumbar Exercises: Standing   Shoulder Extension Strengthening;Both;20 reps    Shoulder Extension Limitations 5    Other Standing Lumbar Exercises Modified DL to 14 in box 6lb 2x10      Lumbar Exercises: Seated   Sit to Stand 20 reps   holding yelow ball   Other Seated Lumbar Exercises OHP yellow ball 2x10      Knee/Hip Exercises: Standing   Forward Step Up 1 set;Both;10 reps;Hand Hold: 0;Step Height: 6"                    PT Short Term Goals - 03/31/21  1141      PT SHORT TERM GOAL #1   Title Pt will be I with initial HEP    Status Achieved             PT Long Term Goals - 03/31/21 1104      PT LONG TERM GOAL #1   Title Pt will be I with advanced HEP    Status Achieved      PT LONG TERM GOAL #2   Title Pt will report resolution of R leg pain    Status Partially Met      PT LONG TERM GOAL #3   Title Pt will demo able to climb flight of stairs with HR and no increase in R leg pain    Status Achieved                 Plan - 03/31/21 1142    Clinical Impression Statement Pt did well overall today. Reports difficulty bending over at home so progressed to  some modified dead lifts. Pt stated  that she could feel her muscles working with the dead lifts. Cues for core engagement needed with sit to stands and seated OHP. Some slight instability with step ups requiring SBA    Personal Factors and Comorbidities Comorbidity 2    Comorbidities HTN, DM    Examination-Activity Limitations Stand;Stairs;Locomotion Level    Examination-Participation Restrictions Yard Work;Community Activity;Interpersonal Relationship    Stability/Clinical Decision Making Stable/Uncomplicated    Rehab Potential Good    PT Frequency 2x / week    PT Duration 6 weeks    PT Treatment/Interventions ADLs/Self Care Home Management;Electrical Stimulation;Iontophoresis 53m/ml Dexamethasone;Moist Heat;Neuromuscular re-education;Balance training;Therapeutic exercise;Therapeutic activities;Functional mobility training;Stair training;Gait training;Patient/family education;Manual techniques;Passive range of motion    PT Next Visit Plan functional LE strengthening, manual/flexibility to B hamstrings, stair progression           Patient will benefit from skilled therapeutic intervention in order to improve the following deficits and impairments:  Abnormal gait,Decreased range of motion,Difficulty walking,Increased muscle spasms,Decreased activity  tolerance,Pain,Impaired flexibility,Hypomobility,Decreased strength  Visit Diagnosis: Pain in right lower leg  Difficulty in walking, not elsewhere classified  Chronic bilateral low back pain without sciatica     Problem List Patient Active Problem List   Diagnosis Date Noted  . Hyperlipidemia 11/19/2016  . Coronary artery disease of native artery of native heart with stable angina pectoris ([place] 06/02/2016  . Pure hypercholesterolemia 06/02/2016  . 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 ALLERGIC RHINITIS 04/03/2008  . ASTHMA 04/03/2008    RScot Jun PTA 03/31/2021, 11:44 AM  [place] [address]Phone: [phone]  Fax:  [phone] Name: Janice ROSENAUMRN: [id number] of Birth: [birth year]

## 2021-04-07 ENCOUNTER — Ambulatory Visit: Payer: Medicare Other | Admitting: Physical Therapy

## 2021-04-07 ENCOUNTER — Encounter: Payer: Self-pay | Admitting: Physical Therapy

## 2021-04-07 ENCOUNTER — Other Ambulatory Visit: Payer: Self-pay

## 2021-04-07 DIAGNOSIS — M79661 Pain in right lower leg: Secondary | ICD-10-CM | POA: Diagnosis not present

## 2021-04-07 DIAGNOSIS — G8929 Other chronic pain: Secondary | ICD-10-CM

## 2021-04-07 DIAGNOSIS — R262 Difficulty in walking, not elsewhere classified: Secondary | ICD-10-CM

## 2021-04-07 NOTE — Therapy (Signed)
[place]. [place], [place], [address] Phone: [phone]   Fax:  [phone]  Physical Therapy Treatment  Patient Details  Name: Janice Mcdonald MRN: [id number] Date of Birth: Feb 08, 1941 Referring Provider (PT): Sherryle Lis Date: 04/07/2021   PT End of Session - 04/07/21 0928    Visit Number 9    Date for PT Re-Evaluation 04/28/21    PT Start Time 0844    PT Stop Time 0924    PT Time Calculation (min) 40 min    Activity Tolerance Patient tolerated treatment well    Behavior During Therapy [facility] for tasks assessed/performed           Past Medical History:  Diagnosis Date  . ALLERGIC RHINITIS   . Asthma   . Diabetes mellitus without complication ([place])   . Hyperlipidemia   . Hypertension   . Osteoarthritis     History reviewed. No pertinent surgical history.  There were no vitals filed for this visit.   Subjective Assessment - 04/07/21 0847    Subjective Pt reports she feels she is improving overall    Currently in Pain? Yes    Pain Score 1     Pain Location Leg    Pain Orientation Left                             [facility] Adult PT Treatment/Exercise - 04/07/21 0001      Lumbar Exercises: Aerobic   Nustep L5 x 6 min      Lumbar Exercises: Machines for Strengthening   Cybex Knee Extension 10# 2x10    Cybex Knee Flexion 25lb 2x15    Other Lumbar Machine Exercise 25# 2x10 rows and lats    Other Lumbar Machine Exercise 10# 2x10 standing shoulder ext      Lumbar Exercises: Standing   Heel Raises 15 reps      Lumbar Exercises: Seated   Sit to Stand 20 reps   2x10 yellow ball chest press and OHP   Other Seated Lumbar Exercises seated iso abs with exercise ball x15 3 sec hold      Knee/Hip Exercises: Standing   Forward Step Up Both;2 sets;5 reps;Hand Hold: 0;Step Height: 6"                    PT Short Term Goals - 03/31/21 1141      PT SHORT TERM GOAL  #1   Title Pt will be I with initial HEP    Status Achieved             PT Long Term Goals - 03/31/21 1104      PT LONG TERM GOAL #1   Title Pt will be I with advanced HEP    Status Achieved      PT LONG TERM GOAL #2   Title Pt will report resolution of R leg pain    Status Partially Met      PT LONG TERM GOAL #3   Title Pt will demo able to climb flight of stairs with HR and no increase in R leg pain    Status Achieved                 Plan - 04/07/21 0928    Clinical Impression Statement Pt tolerated progression of TE well with no c/o of increased LBP. Cuing to avoid compensations with step ups to  6" stair, no assist needed. Cues for core contraction with seated iso abs. Continue to progress to tolerance. Progress note next rx.    PT Treatment/Interventions ADLs/Self Care Home Management;Electrical Stimulation;Iontophoresis 70m/ml Dexamethasone;Moist Heat;Neuromuscular re-education;Balance training;Therapeutic exercise;Therapeutic activities;Functional mobility training;Stair training;Gait training;Patient/family education;Manual techniques;Passive range of motion    PT Next Visit Plan functional LE strengthening, manual/flexibility to B hamstrings, stair progression    Consulted and Agree with Plan of Care Patient           Patient will benefit from skilled therapeutic intervention in order to improve the following deficits and impairments:  Abnormal gait,Decreased range of motion,Difficulty walking,Increased muscle spasms,Decreased activity tolerance,Pain,Impaired flexibility,Hypomobility,Decreased strength  Visit Diagnosis: Pain in right lower leg  Difficulty in walking, not elsewhere classified  Chronic bilateral low back pain without sciatica     Problem List Patient Active Problem List   Diagnosis Date Noted  . Hyperlipidemia 11/19/2016  . Coronary artery disease of native artery of native heart with stable angina pectoris ([place] 06/02/2016  . Pure  hypercholesterolemia 06/02/2016  . 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 ALLERGIC RHINITIS 04/03/2008  . ASTHMA 04/03/2008   AAmador Cunas PT, DPT ADonald ProseSugg 04/07/2021, 9:30 AM  [place] [address]Phone: [phone]  Fax:  [phone] Name: Janice AAMODTMRN: [id number] of Birth: [birth year]

## 2021-04-09 ENCOUNTER — Encounter: Payer: Self-pay | Admitting: Physical Therapy

## 2021-04-09 ENCOUNTER — Other Ambulatory Visit: Payer: Self-pay

## 2021-04-09 ENCOUNTER — Ambulatory Visit: Payer: Medicare Other | Admitting: Physical Therapy

## 2021-04-09 DIAGNOSIS — R262 Difficulty in walking, not elsewhere classified: Secondary | ICD-10-CM

## 2021-04-09 DIAGNOSIS — M79661 Pain in right lower leg: Secondary | ICD-10-CM

## 2021-04-09 DIAGNOSIS — M545 Low back pain, unspecified: Secondary | ICD-10-CM

## 2021-04-09 NOTE — Therapy (Signed)
[place]. [place], [place], [address] Phone: [phone]   Fax:  [phone]  Physical Therapy Treatment  Patient Details  Name: Janice Mcdonald MRN: [id number] Date of Birth: 1941/07/12 Referring Provider (PT): Sherryle Lis Date: 04/09/2021   PT End of Session - 04/09/21 0932    Visit Number 10    Date for PT Re-Evaluation 04/28/21    PT Start Time 0844    PT Stop Time 0926    PT Time Calculation (min) 42 min    Activity Tolerance Patient tolerated treatment well    Behavior During Therapy [facility] for tasks assessed/performed           Past Medical History:  Diagnosis Date  . ALLERGIC RHINITIS   . Asthma   . Diabetes mellitus without complication ([place])   . Hyperlipidemia   . Hypertension   . Osteoarthritis     History reviewed. No pertinent surgical history.  There were no vitals filed for this visit.   Subjective Assessment - 04/09/21 0847    Subjective Pt reports no new changes this rx    Currently in Pain? Yes    Pain Score 2     Pain Location Leg    Pain Orientation Left                             [facility] Adult PT Treatment/Exercise - 04/09/21 0001      Lumbar Exercises: Aerobic   UBE (Upper Arm Bike) L2 x 2 min each    Recumbent Bike L2 x 6 min      Lumbar Exercises: Machines for Strengthening   Cybex Knee Extension 10# 2x10    Cybex Knee Flexion 25lb 2x15    Other Lumbar Machine Exercise 25# 2x10 rows and lats    Other Lumbar Machine Exercise 10# 2x10 standing shoulder ext      Lumbar Exercises: Standing   Heel Raises 15 reps    Other Standing Lumbar Exercises resisted gait 30# x4 each direction      Lumbar Exercises: Seated   Sit to Stand 20 reps   2x10 yellow ball chest press and OHP   Other Seated Lumbar Exercises seated iso abs with exercise ball x15 3 sec hold      Knee/Hip Exercises: Standing   Forward Step Up Both;2 sets;5 reps;Hand Hold:  0;Step Height: 6"                    PT Short Term Goals - 03/31/21 1141      PT SHORT TERM GOAL #1   Title Pt will be I with initial HEP    Status Achieved             PT Long Term Goals - 04/09/21 0934      PT LONG TERM GOAL #1   Title Pt will be I with advanced HEP    Status Achieved      PT LONG TERM GOAL #2   Title Pt will report resolution of R leg pain    Status Partially Met      PT LONG TERM GOAL #3   Title Pt will demo able to climb flight of stairs with HR and no increase in R leg pain    Status Achieved                 Plan - 04/09/21 0932  Clinical Impression Statement Pt making progress toward LTG. Mcdonald improved stability with resisted gait with no instances of LOB. Still Mcdonald some hip weakness with step ups. Reports functional improvements with reduced LE and LBP. Plan to continue PT for a few more weeks then likely discharge with updated HEP.    PT Treatment/Interventions ADLs/Self Care Home Management;Electrical Stimulation;Iontophoresis 33m/ml Dexamethasone;Moist Heat;Neuromuscular re-education;Balance training;Therapeutic exercise;Therapeutic activities;Functional mobility training;Stair training;Gait training;Patient/family education;Manual techniques;Passive range of motion    PT Next Visit Plan functional LE strengthening, manual/flexibility to B hamstrings, stair progression    Consulted and Agree with Plan of Care Patient           Patient will benefit from skilled therapeutic intervention in order to improve the following deficits and impairments:  Abnormal gait,Decreased range of motion,Difficulty walking,Increased muscle spasms,Decreased activity tolerance,Pain,Impaired flexibility,Hypomobility,Decreased strength  Visit Diagnosis: Pain in right lower leg  Difficulty in walking, not elsewhere classified  Chronic bilateral low back pain without sciatica     Problem List Patient Active Problem List   Diagnosis Date  Noted  . Hyperlipidemia 11/19/2016  . Coronary artery disease of native artery of native heart with stable angina pectoris ([place] 06/02/2016  . Pure hypercholesterolemia 06/02/2016  . 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 ALLERGIC RHINITIS 04/03/2008  . ASTHMA 04/03/2008   AAmador Cunas PT, DPT ADonald ProseSugg 04/09/2021, 9:44 AM  [place] [address]Phone: [phone]  Fax:  [phone] Name: Janice DEMOSMRN: [id number] of Birth: 901/08/1941

## 2021-04-14 ENCOUNTER — Encounter: Payer: Self-pay | Admitting: Physical Therapy

## 2021-04-14 ENCOUNTER — Ambulatory Visit: Payer: Medicare Other | Admitting: Physical Therapy

## 2021-04-14 ENCOUNTER — Other Ambulatory Visit: Payer: Self-pay

## 2021-04-14 DIAGNOSIS — R262 Difficulty in walking, not elsewhere classified: Secondary | ICD-10-CM

## 2021-04-14 DIAGNOSIS — M79661 Pain in right lower leg: Secondary | ICD-10-CM

## 2021-04-14 DIAGNOSIS — M545 Low back pain, unspecified: Secondary | ICD-10-CM

## 2021-04-14 DIAGNOSIS — G8929 Other chronic pain: Secondary | ICD-10-CM

## 2021-04-14 NOTE — Therapy (Signed)
[place]. [place], [place], [address] Phone: [phone]   Fax:  [phone]  Physical Therapy Treatment  Patient Details  Name: Janice Mcdonald MRN: [id number] Date of Birth: 06-Oct-1941 Referring Provider (PT): Sherryle Lis Date: 04/14/2021   PT End of Session - 04/14/21 0935    Visit Number 11    Date for PT Re-Evaluation 04/28/21    PT Start Time 0845    PT Stop Time 0929    PT Time Calculation (min) 44 min    Activity Tolerance Patient tolerated treatment well    Behavior During Therapy [facility] for tasks assessed/performed           Past Medical History:  Diagnosis Date  . ALLERGIC RHINITIS   . Asthma   . Diabetes mellitus without complication ([place])   . Hyperlipidemia   . Hypertension   . Osteoarthritis     History reviewed. No pertinent surgical history.  There were no vitals filed for this visit.   Subjective Assessment - 04/14/21 0901    Subjective Pt reports a little sore today after doing a lot of cleaning yesterday    Currently in Pain? Yes    Pain Score 4     Pain Location Leg    Pain Orientation Left                             [facility] Adult PT Treatment/Exercise - 04/14/21 0001      Lumbar Exercises: Aerobic   UBE (Upper Arm Bike) L2 x 2 min each    Nustep L5 x 6 min      Lumbar Exercises: Machines for Strengthening   Cybex Knee Extension 10# 2x10    Cybex Knee Flexion 25lb 2x15    Other Lumbar Machine Exercise 25# 2x10 rows and lats    Other Lumbar Machine Exercise 10# 2x10 standing shoulder ext      Lumbar Exercises: Standing   Heel Raises 15 reps    Other Standing Lumbar Exercises resisted gait 40# x5 each direction      Lumbar Exercises: Seated   Sit to Stand 20 reps   yellow ball chest press and OHP   Other Seated Lumbar Exercises seated iso abs with exercise ball x15 3 sec hold                    PT Short Term Goals - 03/31/21  1141      PT SHORT TERM GOAL #1   Title Pt will be I with initial HEP    Status Achieved             PT Long Term Goals - 04/09/21 0934      PT LONG TERM GOAL #1   Title Pt will be I with advanced HEP    Status Achieved      PT LONG TERM GOAL #2   Title Pt will report resolution of R leg pain    Status Partially Met      PT LONG TERM GOAL #3   Title Pt will demo able to climb flight of stairs with HR and no increase in R leg pain    Status Achieved                 Plan - 04/14/21 0936    Clinical Impression Statement Pt continues to make progress toward goals; demos improved stability with  resisted gait. Able to tolerate progression of weight with no LOB. Discussed plan to continue PT until mid May when she leaves for summer vacation; will discharge at this point with updated HEP. Pt VU and agreement.    PT Treatment/Interventions ADLs/Self Care Home Management;Electrical Stimulation;Iontophoresis 68m/ml Dexamethasone;Moist Heat;Neuromuscular re-education;Balance training;Therapeutic exercise;Therapeutic activities;Functional mobility training;Stair training;Gait training;Patient/family education;Manual techniques;Passive range of motion    PT Next Visit Plan functional LE strengthening, manual/flexibility to B hamstrings, stair progression    Consulted and Agree with Plan of Care Patient           Patient will benefit from skilled therapeutic intervention in order to improve the following deficits and impairments:  Abnormal gait,Decreased range of motion,Difficulty walking,Increased muscle spasms,Decreased activity tolerance,Pain,Impaired flexibility,Hypomobility,Decreased strength  Visit Diagnosis: Pain in right lower leg  Difficulty in walking, not elsewhere classified  Chronic bilateral low back pain without sciatica     Problem List Patient Active Problem List   Diagnosis Date Noted  . Hyperlipidemia 11/19/2016  . Coronary artery disease of native  artery of native heart with stable angina pectoris ([place] 06/02/2016  . Pure hypercholesterolemia 06/02/2016  . 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 ALLERGIC RHINITIS 04/03/2008  . ASTHMA 04/03/2008   AAmador Cunas PT, DPT ADonald ProseSugg 04/14/2021, 9:37 AM  [place] [address]Phone: [phone]  Fax:  [phone] Name: KMELIDA NORTHINGTONMRN: [id number] of Birth: 902-Feb-1942

## 2021-04-15 ENCOUNTER — Other Ambulatory Visit: Payer: Self-pay | Admitting: Cardiology

## 2021-04-16 ENCOUNTER — Ambulatory Visit: Payer: Medicare Other | Admitting: Physical Therapy

## 2021-04-16 ENCOUNTER — Other Ambulatory Visit: Payer: Self-pay

## 2021-04-16 ENCOUNTER — Encounter: Payer: Self-pay | Admitting: Physical Therapy

## 2021-04-16 DIAGNOSIS — G8929 Other chronic pain: Secondary | ICD-10-CM

## 2021-04-16 DIAGNOSIS — M79661 Pain in right lower leg: Secondary | ICD-10-CM | POA: Diagnosis not present

## 2021-04-16 DIAGNOSIS — R262 Difficulty in walking, not elsewhere classified: Secondary | ICD-10-CM

## 2021-04-16 NOTE — Therapy (Signed)
[place]. [place], [place], [address] Phone: [phone]   Fax:  [phone]  Physical Therapy Treatment  Patient Details  Name: Janice Mcdonald MRN: [id number] Date of Birth: [birth year] Referring Provider (PT): Sherryle Lis Date: 04/16/2021   PT End of Session - 04/16/21 0932    Visit Number 12    Date for PT Re-Evaluation 04/28/21    PT Start Time 0842    PT Stop Time 0926    PT Time Calculation (min) 44 min    Activity Tolerance Patient tolerated treatment well    Behavior During Therapy [facility] for tasks assessed/performed           Past Medical History:  Diagnosis Date  . ALLERGIC RHINITIS   . Asthma   . Diabetes mellitus without complication ([place])   . Hyperlipidemia   . Hypertension   . Osteoarthritis     History reviewed. No pertinent surgical history.  There were no vitals filed for this visit.   Subjective Assessment - 04/16/21 0843    Subjective Pt reports that she had increased L sided LBP with radiating pain yesterday, resolving today. Denies radiating pain today but states that pain is concentrated in L LB.    Currently in Pain? Yes    Pain Score 4     Pain Location Back    Pain Orientation Left;Lower                             [facility] Adult PT Treatment/Exercise - 04/16/21 0001      Lumbar Exercises: Stretches   Active Hamstring Stretch Right;Left;2 reps;20 seconds    Piriformis Stretch Right;Left;2 reps;20 seconds    Piriformis Stretch Limitations seated    Gastroc Stretch Right;Left;1 rep;20 seconds    Other Lumbar Stretch Exercise seated flexion with exercise ball x4 fwd/lat 5 sec hold      Lumbar Exercises: Aerobic   UBE (Upper Arm Bike) L2 x 2 min each    Nustep L5 x 6 min      Lumbar Exercises: Machines for Strengthening   Cybex Knee Extension 10# 2x10    Cybex Knee Flexion 25lb 2x15    Other Lumbar Machine Exercise 25# 2x10 rows and lats     Other Lumbar Machine Exercise 10# 2x15 standing shoulder ext      Lumbar Exercises: Standing   Heel Raises 15 reps      Lumbar Exercises: Seated   Sit to Stand 20 reps   yellow ball chest press and OHP                   PT Short Term Goals - 03/31/21 1141      PT SHORT TERM GOAL #1   Title Pt will be I with initial HEP    Status Achieved             PT Long Term Goals - 04/09/21 0934      PT LONG TERM GOAL #1   Title Pt will be I with advanced HEP    Status Achieved      PT LONG TERM GOAL #2   Title Pt will report resolution of R leg pain    Status Partially Met      PT LONG TERM GOAL #3   Title Pt will demo able to climb flight of stairs with HR and no increase in R leg pain  Status Achieved                 Plan - 04/16/21 0932    Clinical Impression Statement Pt presents to clinic reporting increased LBP over the past two days. Focused on some LE/lumbar stretching ex's to start, pt reporting relief after. Able to tolerate the remainder of ex's with no increase in LBP. Continue to progress to tolerance.    PT Treatment/Interventions ADLs/Self Care Home Management;Electrical Stimulation;Iontophoresis 41m/ml Dexamethasone;Moist Heat;Neuromuscular re-education;Balance training;Therapeutic exercise;Therapeutic activities;Functional mobility training;Stair training;Gait training;Patient/family education;Manual techniques;Passive range of motion    PT Next Visit Plan functional LE strengthening, manual/flexibility to B hamstrings, stair progression    Consulted and Agree with Plan of Care Patient           Patient will benefit from skilled therapeutic intervention in order to improve the following deficits and impairments:  Abnormal gait,Decreased range of motion,Difficulty walking,Increased muscle spasms,Decreased activity tolerance,Pain,Impaired flexibility,Hypomobility,Decreased strength  Visit Diagnosis: Pain in right lower leg  Difficulty in  walking, not elsewhere classified  Chronic bilateral low back pain without sciatica     Problem List Patient Active Problem List   Diagnosis Date Noted  . Hyperlipidemia 11/19/2016  . Coronary artery disease of native artery of native heart with stable angina pectoris ([facility] 06/02/2016  . Pure hypercholesterolemia 06/02/2016  . 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 ALLERGIC RHINITIS 04/03/2008  . ASTHMA 04/03/2008   AAmador Cunas PT, DPT ADonald ProseSugg 04/16/2021, 9:34 AM  [place] [address]Phone: [phone]  Fax:  [phone] Name: Janice POTOCKIMRN: [id number] of Birth: 9March 30, 1942

## 2021-04-21 ENCOUNTER — Encounter: Payer: Self-pay | Admitting: Physical Therapy

## 2021-04-21 ENCOUNTER — Ambulatory Visit: Payer: Medicare Other | Attending: Sports Medicine | Admitting: Physical Therapy

## 2021-04-21 ENCOUNTER — Other Ambulatory Visit: Payer: Self-pay

## 2021-04-21 DIAGNOSIS — M79661 Pain in right lower leg: Secondary | ICD-10-CM | POA: Insufficient documentation

## 2021-04-21 DIAGNOSIS — R262 Difficulty in walking, not elsewhere classified: Secondary | ICD-10-CM | POA: Diagnosis present

## 2021-04-21 DIAGNOSIS — G8929 Other chronic pain: Secondary | ICD-10-CM | POA: Insufficient documentation

## 2021-04-21 DIAGNOSIS — M545 Low back pain, unspecified: Secondary | ICD-10-CM | POA: Insufficient documentation

## 2021-04-21 NOTE — Therapy (Signed)
[place]. [place], [place], [address] Phone: [phone]   Fax:  [phone]  Physical Therapy Treatment  Patient Details  Name: Janice Mcdonald MRN: [id number] Date of Birth: 1941-07-25 Referring Provider (PT): Sherryle Lis Date: 04/21/2021   PT End of Session - 04/21/21 1132    Visit Number 13    Date for PT Re-Evaluation 04/28/21    PT Start Time 1056    PT Stop Time 1136    PT Time Calculation (min) 40 min    Activity Tolerance Patient tolerated treatment well    Behavior During Therapy [facility] for tasks assessed/performed           Past Medical History:  Diagnosis Date  . ALLERGIC RHINITIS   . Asthma   . Diabetes mellitus without complication ([place])   . Hyperlipidemia   . Hypertension   . Osteoarthritis     History reviewed. No pertinent surgical history.  There were no vitals filed for this visit.   Subjective Assessment - 04/21/21 1101    Subjective Pt reports pain has improved greatly since last week    Currently in Pain? Yes    Pain Score 2     Pain Location Back    Pain Orientation Left;Lower                             [facility] Adult PT Treatment/Exercise - 04/21/21 0001      Lumbar Exercises: Stretches   Gastroc Stretch Right;Left;1 rep;20 seconds      Lumbar Exercises: Aerobic   UBE (Upper Arm Bike) L2 x 2 min each    Nustep L5 x 6 min      Lumbar Exercises: Machines for Strengthening   Cybex Knee Extension 10# 2x10    Cybex Knee Flexion 25lb 2x15    Other Lumbar Machine Exercise 25# 2x10 rows and lats    Other Lumbar Machine Exercise 10# 2x15 standing shoulder ext      Lumbar Exercises: Standing   Heel Raises 15 reps      Lumbar Exercises: Seated   Sit to Stand 20 reps   yellow ball chest press and OHP     Knee/Hip Exercises: Standing   Lateral Step Up Both;1 set;10 reps;Hand Hold: 0;Step Height: 6"    Forward Step Up Both;2 sets;5 reps;Hand  Hold: 0;Step Height: 6"                    PT Short Term Goals - 03/31/21 1141      PT SHORT TERM GOAL #1   Title Pt will be I with initial HEP    Status Achieved             PT Long Term Goals - 04/09/21 0934      PT LONG TERM GOAL #1   Title Pt will be I with advanced HEP    Status Achieved      PT LONG TERM GOAL #2   Title Pt will report resolution of R leg pain    Status Partially Met      PT LONG TERM GOAL #3   Title Pt will demo able to climb flight of stairs with HR and no increase in R leg pain    Status Achieved                 Plan - 04/21/21 1133  Clinical Impression Statement Pt presents to clinic reporting improvement in LBP since last rx. Demos good tolerance to progression of exercise. Good stability with fwd/lat step ups; some compensation noted with increased fatigue but able to self correct, no LOB. Continue to progress to tolerance.    PT Treatment/Interventions ADLs/Self Care Home Management;Electrical Stimulation;Iontophoresis 91m/ml Dexamethasone;Moist Heat;Neuromuscular re-education;Balance training;Therapeutic exercise;Therapeutic activities;Functional mobility training;Stair training;Gait training;Patient/family education;Manual techniques;Passive range of motion    PT Next Visit Plan functional LE strengthening, manual/flexibility to B hamstrings, stair progression    Consulted and Agree with Plan of Care Patient           Patient will benefit from skilled therapeutic intervention in order to improve the following deficits and impairments:  Abnormal gait,Decreased range of motion,Difficulty walking,Increased muscle spasms,Decreased activity tolerance,Pain,Impaired flexibility,Hypomobility,Decreased strength  Visit Diagnosis: Pain in right lower leg  Difficulty in walking, not elsewhere classified  Chronic bilateral low back pain without sciatica     Problem List Patient Active Problem List   Diagnosis Date Noted  .  Hyperlipidemia 11/19/2016  . Coronary artery disease of native artery of native heart with stable angina pectoris ([place] 06/02/2016  . Pure hypercholesterolemia 06/02/2016  . 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 ALLERGIC RHINITIS 04/03/2008  . ASTHMA 04/03/2008   AAmador Cunas PT, DPT ADonald ProseSugg 04/21/2021, 11:35 AM  [place] [address]Phone: [phone]  Fax:  [phone] Name: Janice WALPOLEMRN: [id number] of Birth: [birth year]

## 2021-04-24 ENCOUNTER — Other Ambulatory Visit: Payer: Self-pay

## 2021-04-24 ENCOUNTER — Encounter: Payer: Self-pay | Admitting: Physical Therapy

## 2021-04-24 ENCOUNTER — Ambulatory Visit: Payer: Medicare Other | Admitting: Physical Therapy

## 2021-04-24 DIAGNOSIS — R262 Difficulty in walking, not elsewhere classified: Secondary | ICD-10-CM

## 2021-04-24 DIAGNOSIS — M79661 Pain in right lower leg: Secondary | ICD-10-CM

## 2021-04-24 DIAGNOSIS — M545 Low back pain, unspecified: Secondary | ICD-10-CM

## 2021-04-24 DIAGNOSIS — G8929 Other chronic pain: Secondary | ICD-10-CM

## 2021-04-24 NOTE — Therapy (Signed)
[place]. [place], [place], [address] Phone: [phone]   Fax:  [phone]  Physical Therapy Treatment  Patient Details  Name: LYLLA EIFLER MRN: [id number] Date of Birth: 10/22/1941 Referring Provider (PT): Sherryle Lis Date: 04/24/2021   PT End of Session - 04/24/21 1134    Visit Number 14    Date for PT Re-Evaluation 04/28/21    PT Start Time 1054    PT Stop Time 1135    PT Time Calculation (min) 41 min    Activity Tolerance Patient tolerated treatment well    Behavior During Therapy [facility] for tasks assessed/performed           Past Medical History:  Diagnosis Date  . ALLERGIC RHINITIS   . Asthma   . Diabetes mellitus without complication ([place])   . Hyperlipidemia   . Hypertension   . Osteoarthritis     History reviewed. No pertinent surgical history.  There were no vitals filed for this visit.   Subjective Assessment - 04/24/21 1053    Subjective Pt states no new changes since last rx    Currently in Pain? Yes    Pain Score 1     Pain Location Back                             [facility] Adult PT Treatment/Exercise - 04/24/21 0001      Lumbar Exercises: Aerobic   Nustep L5 x 6 min      Lumbar Exercises: Machines for Strengthening   Cybex Knee Extension 10# 2x10    Cybex Knee Flexion 25lb 2x15    Other Lumbar Machine Exercise 25# 2x10 rows and lats    Other Lumbar Machine Exercise 10# 2x15 standing shoulder ext      Lumbar Exercises: Standing   Heel Raises 15 reps    Other Standing Lumbar Exercises resisted gait 40# x5 each direction      Lumbar Exercises: Seated   Sit to Stand 20 reps   yellow ball chest press and OHP   Other Seated Lumbar Exercises seated iso abs with exercise ball x15 3 sec hold                    PT Short Term Goals - 03/31/21 1141      PT SHORT TERM GOAL #1   Title Pt will be I with initial HEP    Status Achieved              PT Long Term Goals - 04/09/21 0934      PT LONG TERM GOAL #1   Title Pt will be I with advanced HEP    Status Achieved      PT LONG TERM GOAL #2   Title Pt will report resolution of R leg pain    Status Partially Met      PT LONG TERM GOAL #3   Title Pt will demo able to climb flight of stairs with HR and no increase in R leg pain    Status Achieved                 Plan - 04/24/21 1135    Clinical Impression Statement Pt continues to tolerate progression of TE well with no c/o of increase in LBP. Increased stability with resisted gait, no LOB. Still demos trendelenburg with gait but improving. Increased eccentric control with STS.  Continue to progress to tolerance working toward d/c at last scheduled rx.    PT Treatment/Interventions ADLs/Self Care Home Management;Electrical Stimulation;Iontophoresis 4mg/ml Dexamethasone;Moist Heat;Neuromuscular re-education;Balance training;Therapeutic exercise;Therapeutic activities;Functional mobility training;Stair training;Gait training;Patient/family education;Manual techniques;Passive range of motion    PT Next Visit Plan functional LE strengthening, manual/flexibility to B hamstrings, stair progression    Consulted and Agree with Plan of Care Patient           Patient will benefit from skilled therapeutic intervention in order to improve the following deficits and impairments:  Abnormal gait,Decreased range of motion,Difficulty walking,Increased muscle spasms,Decreased activity tolerance,Pain,Impaired flexibility,Hypomobility,Decreased strength  Visit Diagnosis: Pain in right lower leg  Difficulty in walking, not elsewhere classified  Chronic bilateral low back pain without sciatica     Problem List Patient Active Problem List   Diagnosis Date Noted  . Hyperlipidemia 11/19/2016  . Coronary artery disease of native artery of native heart with stable angina pectoris ([facility]) 06/02/2016  . Pure hypercholesterolemia  06/02/2016  . 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 ALLERGIC RHINITIS 04/03/2008  . ASTHMA 04/03/2008   Amanda Sugg, PT, DPT Amanda M Sugg 04/24/2021, 11:36 AM  [facility] [address] [place], [place], [address] Phone: [phone]   Fax:  [phone]  Name: Rya M Craigo MRN: [id number] Date of Birth: 08/01/1941   

## 2021-04-28 ENCOUNTER — Other Ambulatory Visit: Payer: Self-pay

## 2021-04-28 ENCOUNTER — Ambulatory Visit: Payer: Medicare Other | Admitting: Physical Therapy

## 2021-04-28 ENCOUNTER — Encounter: Payer: Self-pay | Admitting: Physical Therapy

## 2021-04-28 DIAGNOSIS — M79661 Pain in right lower leg: Secondary | ICD-10-CM | POA: Diagnosis not present

## 2021-04-28 DIAGNOSIS — G8929 Other chronic pain: Secondary | ICD-10-CM

## 2021-04-28 DIAGNOSIS — M545 Low back pain, unspecified: Secondary | ICD-10-CM

## 2021-04-28 DIAGNOSIS — R262 Difficulty in walking, not elsewhere classified: Secondary | ICD-10-CM

## 2021-04-28 NOTE — Therapy (Signed)
[place] [place], [place], [address] Phone: [phone]   Fax:  [phone]  Physical Therapy Treatment  Patient Details  Name: Janice Mcdonald MRN: [id number] Date of Birth: 06-03-1941 Referring Provider (PT): Sherryle Lis Date: 04/28/2021   PT End of Session - 04/28/21 0925    Visit Number 15    Date for PT Re-Evaluation 05/29/21    PT Start Time 0844    PT Stop Time 0926    PT Time Calculation (min) 42 min    Activity Tolerance Patient tolerated treatment well    Behavior During Therapy [facility] for tasks assessed/performed           Past Medical History:  Diagnosis Date  . ALLERGIC RHINITIS   . Asthma   . Diabetes mellitus without complication ([place])   . Hyperlipidemia   . Hypertension   . Osteoarthritis     History reviewed. No pertinent surgical history.  There were no vitals filed for this visit.   Subjective Assessment - 04/28/21 0850    Subjective Pt states no new changes since last rx    Currently in Pain? Yes    Pain Score 1     Pain Location Back                             [facility] Adult PT Treatment/Exercise - 04/28/21 0001      Lumbar Exercises: Stretches   Gastroc Stretch Right;Left;1 rep;20 seconds      Lumbar Exercises: Aerobic   UBE (Upper Arm Bike) L2 x 2 min each    Recumbent Bike L2 x 6 min      Lumbar Exercises: Machines for Strengthening   Cybex Knee Extension 10# 2x10    Cybex Knee Flexion 25lb 2x15    Other Lumbar Machine Exercise 25# 2x15 rows and lats    Other Lumbar Machine Exercise 10# 2x15 standing shoulder ext      Lumbar Exercises: Standing   Heel Raises 15 reps    Other Standing Lumbar Exercises resisted gait 40# x5 each direction      Lumbar Exercises: Seated   Sit to Stand 20 reps   yellow ball chest press and OHP     Knee/Hip Exercises: Standing   Lateral Step Up Both;1 set;10 reps;Hand Hold: 0;Step Height: 6"    Forward  Step Up Both;2 sets;5 reps;Hand Hold: 0;Step Height: 6"    Forward Step Up Limitations close supervision-CGA                    PT Short Term Goals - 03/31/21 1141      PT SHORT TERM GOAL #1   Title Pt will be I with initial HEP    Status Achieved             PT Long Term Goals - 04/28/21 0927      PT LONG TERM GOAL #1   Title Pt will be I with advanced HEP    Status Achieved      PT LONG TERM GOAL #2   Title Pt will report resolution of R leg pain    Status Partially Met      PT LONG TERM GOAL #3   Title Pt will demo able to climb flight of stairs with HR and no increase in R leg pain    Status Achieved  Plan - 04/28/21 0926    Clinical Impression Statement Pt making progress toward goals. Still demos trendelenburg especially with increasing fatigue. Did well with progression of TE today with no c/o increased LBP. Did have one instance of LOB requiring minA when stepping down off 6" step. She has tendency to catch feet and foot clearance decreases when fatigued. Continue to progress to tolerance.    PT Treatment/Interventions ADLs/Self Care Home Management;Electrical Stimulation;Iontophoresis 4m/ml Dexamethasone;Moist Heat;Neuromuscular re-education;Balance training;Therapeutic exercise;Therapeutic activities;Functional mobility training;Stair training;Gait training;Patient/family education;Manual techniques;Passive range of motion    PT Next Visit Plan functional LE strengthening, manual/flexibility to B hamstrings, stair progression    Consulted and Agree with Plan of Care Patient           Patient will benefit from skilled therapeutic intervention in order to improve the following deficits and impairments:  Abnormal gait,Decreased range of motion,Difficulty walking,Increased muscle spasms,Decreased activity tolerance,Pain,Impaired flexibility,Hypomobility,Decreased strength  Visit Diagnosis: Pain in right lower leg  Difficulty in  walking, not elsewhere classified  Chronic bilateral low back pain without sciatica     Problem List Patient Active Problem List   Diagnosis Date Noted  . Hyperlipidemia 11/19/2016  . Coronary artery disease of native artery of native heart with stable angina pectoris ([place] 06/02/2016  . Pure hypercholesterolemia 06/02/2016  . 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 ALLERGIC RHINITIS 04/03/2008  . ASTHMA 04/03/2008   AAmador Cunas PT, DPT ADonald ProseSugg 04/28/2021, 9:28 AM  [place] [address]Phone: [phone]  Fax:  [phone] Name: Janice MORTMRN: [id number] of Birth: 9Sep 06, 1942

## 2021-04-30 ENCOUNTER — Other Ambulatory Visit: Payer: Self-pay

## 2021-04-30 ENCOUNTER — Encounter: Payer: Self-pay | Admitting: Physical Therapy

## 2021-04-30 ENCOUNTER — Ambulatory Visit: Payer: Medicare Other | Admitting: Physical Therapy

## 2021-04-30 DIAGNOSIS — M79661 Pain in right lower leg: Secondary | ICD-10-CM | POA: Diagnosis not present

## 2021-04-30 DIAGNOSIS — R262 Difficulty in walking, not elsewhere classified: Secondary | ICD-10-CM

## 2021-04-30 DIAGNOSIS — G8929 Other chronic pain: Secondary | ICD-10-CM

## 2021-04-30 NOTE — Therapy (Signed)
[place]. [place], [place], [address] Phone: [phone]   Fax:  [phone]  Physical Therapy Treatment  Patient Details  Name: Janice Mcdonald MRN: [id number] Date of Birth: July 14, 1941 Referring Provider (PT): Sherryle Lis Date: 04/30/2021   PT End of Session - 04/30/21 1143    Visit Number 16    Date for PT Re-Evaluation 05/29/21    PT Start Time 1056    PT Stop Time 1142    PT Time Calculation (min) 46 min    Activity Tolerance Patient tolerated treatment well    Behavior During Therapy [facility] for tasks assessed/performed           Past Medical History:  Diagnosis Date  . ALLERGIC RHINITIS   . Asthma   . Diabetes mellitus without complication ([place])   . Hyperlipidemia   . Hypertension   . Osteoarthritis     History reviewed. No pertinent surgical history.  There were no vitals filed for this visit.   Subjective Assessment - 04/30/21 1057    Subjective Pt states no new changes since last rx; back a little sore from busy day yesterday    Currently in Pain? Yes    Pain Score 4     Pain Location Back                             [facility] Adult PT Treatment/Exercise - 04/30/21 0001      Lumbar Exercises: Aerobic   UBE (Upper Arm Bike) L2 x 2 min each    Nustep L5 x 6 min      Lumbar Exercises: Machines for Strengthening   Cybex Knee Extension 10# 2x10    Cybex Knee Flexion 25lb 2x15    Other Lumbar Machine Exercise 25# 2x15 rows and lats      Lumbar Exercises: Standing   Heel Raises 15 reps    Other Standing Lumbar Exercises resisted gait 40# x5 each direction      Lumbar Exercises: Seated   Sit to Stand 10 reps   yellow ball chest press     Knee/Hip Exercises: Standing   Lateral Step Up Both;1 set;10 reps;Hand Hold: 0;Step Height: 6"    Forward Step Up Both;1 set;10 reps;Hand Hold: 0;Step Height: 6"    Forward Step Up Limitations cues for improved foot  clearance                    PT Short Term Goals - 03/31/21 1141      PT SHORT TERM GOAL #1   Title Pt will be I with initial HEP    Status Achieved             PT Long Term Goals - 04/28/21 0927      PT LONG TERM GOAL #1   Title Pt will be I with advanced HEP    Status Achieved      PT LONG TERM GOAL #2   Title Pt will report resolution of R leg pain    Status Partially Met      PT LONG TERM GOAL #3   Title Pt will demo able to climb flight of stairs with HR and no increase in R leg pain    Status Achieved                 Plan - 04/30/21 1144    Clinical Impression Statement  Pt doing well with progression of TE, no c/o of increased LBP with exercise. Cuing with forward and lateral step ups for increased step length to improve foot clearance. No catching of feet on stairs this rx. Educated on use of handrail and to wear shoes when climbing her carpeted stairs in [place] with pt VU and agreement. Continue to progress functional strengthening to tolerance.    PT Treatment/Interventions ADLs/Self Care Home Management;Electrical Stimulation;Iontophoresis 65m/ml Dexamethasone;Moist Heat;Neuromuscular re-education;Balance training;Therapeutic exercise;Therapeutic activities;Functional mobility training;Stair training;Gait training;Patient/family education;Manual techniques;Passive range of motion    PT Next Visit Plan functional LE strengthening, manual/flexibility to B hamstrings, stair progression    Consulted and Agree with Plan of Care Patient           Patient will benefit from skilled therapeutic intervention in order to improve the following deficits and impairments:  Abnormal gait,Decreased range of motion,Difficulty walking,Increased muscle spasms,Decreased activity tolerance,Pain,Impaired flexibility,Hypomobility,Decreased strength  Visit Diagnosis: Pain in right lower leg  Difficulty in walking, not elsewhere classified  Chronic bilateral low  back pain without sciatica     Problem List Patient Active Problem List   Diagnosis Date Noted  . Hyperlipidemia 11/19/2016  . Coronary artery disease of native artery of native heart with stable angina pectoris ([place] 06/02/2016  . Pure hypercholesterolemia 06/02/2016  . 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 ALLERGIC RHINITIS 04/03/2008  . ASTHMA 04/03/2008   AAmador Cunas PT, DPT ADonald ProseSugg 04/30/2021, 11:45 AM  [place] [address]Phone: [phone]  Fax:  [phone] Name: Janice Mcdonald

## 2021-05-04 ENCOUNTER — Other Ambulatory Visit: Payer: Self-pay | Admitting: Cardiology

## 2021-05-05 ENCOUNTER — Ambulatory Visit: Payer: Medicare Other | Admitting: Physical Therapy

## 2021-05-05 ENCOUNTER — Other Ambulatory Visit: Payer: Self-pay

## 2021-05-05 ENCOUNTER — Encounter: Payer: Self-pay | Admitting: Physical Therapy

## 2021-05-05 DIAGNOSIS — R262 Difficulty in walking, not elsewhere classified: Secondary | ICD-10-CM

## 2021-05-05 DIAGNOSIS — M79661 Pain in right lower leg: Secondary | ICD-10-CM | POA: Diagnosis not present

## 2021-05-05 DIAGNOSIS — M545 Low back pain, unspecified: Secondary | ICD-10-CM

## 2021-05-05 DIAGNOSIS — G8929 Other chronic pain: Secondary | ICD-10-CM

## 2021-05-05 NOTE — Therapy (Signed)
[place]. [place], [place], [address] Phone: [phone]   Fax:  [phone]  Physical Therapy Treatment  Patient Details  Name: Janice Mcdonald MRN: [id number] Date of Birth: [birth year] Referring Provider (PT): Sherryle Lis Date: 05/05/2021   PT End of Session - 05/05/21 1001    Visit Number 17    Date for PT Re-Evaluation 05/29/21    PT Start Time 0918    PT Stop Time 1001    PT Time Calculation (min) 43 min    Activity Tolerance Patient tolerated treatment well    Behavior During Therapy [facility] for tasks assessed/performed           Past Medical History:  Diagnosis Date  . ALLERGIC RHINITIS   . Asthma   . Diabetes mellitus without complication ([place])   . Hyperlipidemia   . Hypertension   . Osteoarthritis     History reviewed. No pertinent surgical history.  There were no vitals filed for this visit.   Subjective Assessment - 05/05/21 0918    Subjective Pt states no new changes    Currently in Pain? Yes    Pain Score 1     Pain Location Back                             [facility] Adult PT Treatment/Exercise - 05/05/21 0001      Lumbar Exercises: Aerobic   UBE (Upper Arm Bike) L2 x 2 min each    Nustep L5 x 6 min      Lumbar Exercises: Machines for Strengthening   Cybex Knee Extension 10# 2x10    Cybex Knee Flexion 25lb 2x15    Other Lumbar Machine Exercise 25# 2x15 rows and lats    Other Lumbar Machine Exercise 10# 2x15 standing shoulder ext      Lumbar Exercises: Standing   Heel Raises 15 reps      Lumbar Exercises: Seated   Sit to Stand 20 reps   yellow ball chest press and OHP   Other Seated Lumbar Exercises seated iso abs with exercise ball x15 3 sec hold      Knee/Hip Exercises: Standing   Lateral Step Up Both;1 set;10 reps;Hand Hold: 0;Step Height: 6"    Forward Step Up Both;1 set;10 reps;Hand Hold: 0;Step Height: 6"    Forward Step Up Limitations  demos improved foot clearance B                    PT Short Term Goals - 03/31/21 1141      PT SHORT TERM GOAL #1   Title Pt will be I with initial HEP    Status Achieved             PT Long Term Goals - 04/28/21 0927      PT LONG TERM GOAL #1   Title Pt will be I with advanced HEP    Status Achieved      PT LONG TERM GOAL #2   Title Pt will report resolution of R leg pain    Status Partially Met      PT LONG TERM GOAL #3   Title Pt will demo able to climb flight of stairs with HR and no increase in R leg pain    Status Achieved                 Plan - 05/05/21  1001    Clinical Impression Statement Pt continues to do well with minimal residual LBP and occasional knee pain in mornings, improves with movements. Discussed plans for continuation of ex's along with pt potential to join independent gym program when she returns from [place] in the fall. Did well with all ex's this rx with no c/o of increased pain. Plan for d/c at next rx.    PT Treatment/Interventions ADLs/Self Care Home Management;Electrical Stimulation;Iontophoresis 73m/ml Dexamethasone;Moist Heat;Neuromuscular re-education;Balance training;Therapeutic exercise;Therapeutic activities;Functional mobility training;Stair training;Gait training;Patient/family education;Manual techniques;Passive range of motion    PT Next Visit Plan functional LE strengthening, manual/flexibility to B hamstrings, stair progression    Consulted and Agree with Plan of Care Patient           Patient will benefit from skilled therapeutic intervention in order to improve the following deficits and impairments:  Abnormal gait,Decreased range of motion,Difficulty walking,Increased muscle spasms,Decreased activity tolerance,Pain,Impaired flexibility,Hypomobility,Decreased strength  Visit Diagnosis: Pain in right lower leg  Difficulty in walking, not elsewhere classified  Chronic bilateral low back pain without  sciatica     Problem List Patient Active Problem List   Diagnosis Date Noted  . Hyperlipidemia 11/19/2016  . Coronary artery disease of native artery of native heart with stable angina pectoris ([place] 06/02/2016  . Pure hypercholesterolemia 06/02/2016  . 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 ALLERGIC RHINITIS 04/03/2008  . ASTHMA 04/03/2008   AAmador Cunas PT, DPT ADonald ProseSugg 05/05/2021, 10:03 AM  [place] [address]Phone: [phone]  Fax:  [phone] Name: Janice HERNEMRN: [id number] of Birth: [birth year]

## 2021-05-07 ENCOUNTER — Other Ambulatory Visit: Payer: Self-pay

## 2021-05-07 ENCOUNTER — Ambulatory Visit: Payer: Medicare Other | Admitting: Physical Therapy

## 2021-05-07 ENCOUNTER — Encounter: Payer: Self-pay | Admitting: Physical Therapy

## 2021-05-07 DIAGNOSIS — R262 Difficulty in walking, not elsewhere classified: Secondary | ICD-10-CM

## 2021-05-07 DIAGNOSIS — M79661 Pain in right lower leg: Secondary | ICD-10-CM

## 2021-05-07 DIAGNOSIS — M545 Low back pain, unspecified: Secondary | ICD-10-CM

## 2021-05-07 DIAGNOSIS — G8929 Other chronic pain: Secondary | ICD-10-CM

## 2021-05-07 NOTE — Therapy (Signed)
[place]. [place], [place], [address] Phone: [phone]   Fax:  [phone]  Physical Therapy Treatment  Patient Details  Name: Janice Mcdonald MRN: [id number] Date of Birth: 09-14-1941 Referring Provider (PT): Sherryle Lis Date: 05/07/2021   PT End of Session - 05/07/21 1148    Visit Number 18    Date for PT Re-Evaluation 05/29/21    PT Start Time 1058    PT Stop Time 1141    PT Time Calculation (min) 43 min    Activity Tolerance Patient tolerated treatment well    Behavior During Therapy [facility] for tasks assessed/performed           Past Medical History:  Diagnosis Date  . ALLERGIC RHINITIS   . Asthma   . Diabetes mellitus without complication ([place])   . Hyperlipidemia   . Hypertension   . Osteoarthritis     History reviewed. No pertinent surgical history.  There were no vitals filed for this visit.   Subjective Assessment - 05/07/21 1106    Subjective Pt states no new changes    Currently in Pain? Yes    Pain Score 1     Pain Location Back                             [facility] Adult PT Treatment/Exercise - 05/07/21 0001      Lumbar Exercises: Aerobic   UBE (Upper Arm Bike) L2 x 2 min each    Nustep L5 x 6 min      Lumbar Exercises: Machines for Strengthening   Cybex Knee Extension 10# 2x10    Cybex Knee Flexion 25lb 2x15    Other Lumbar Machine Exercise 25# 2x15 rows and lats    Other Lumbar Machine Exercise 10# 2x15 standing shoulder ext      Lumbar Exercises: Standing   Heel Raises 15 reps    Other Standing Lumbar Exercises resisted gait 40# x5 each direction      Knee/Hip Exercises: Standing   Lateral Step Up Both;1 set;10 reps;Hand Hold: 0;Step Height: 6"    Forward Step Up Both;1 set;10 reps;Hand Hold: 0;Step Height: 6"    Forward Step Up Limitations demos improved foot clearance B                    PT Short Term Goals - 03/31/21 1141       PT SHORT TERM GOAL #1   Title Pt will be I with initial HEP    Status Achieved             PT Long Term Goals - 05/07/21 1107      PT LONG TERM GOAL #1   Title Pt will be I with advanced HEP    Status Achieved      PT LONG TERM GOAL #2   Title Pt will report resolution of R leg pain    Status Achieved      PT LONG TERM GOAL #3   Title Pt will demo able to climb flight of stairs with HR and no increase in R leg pain    Status Achieved                 Plan - 05/07/21 1148    Clinical Impression Statement Pt recommended for discharge today secondary to meeting all goals and being pleased with functional progress. Demos signficiant improvement in  functional LE strength and mobility. Pt does still endorse some LBP with bending over. Discussed continuation of HEP and when to return if symptoms worsen or recur with pt VU and agreement. She plans to call to join the independent gym program in the fall when she returns from [place].    PT Treatment/Interventions ADLs/Self Care Home Management;Electrical Stimulation;Iontophoresis 35m/ml Dexamethasone;Moist Heat;Neuromuscular re-education;Balance training;Therapeutic exercise;Therapeutic activities;Functional mobility training;Stair training;Gait training;Patient/family education;Manual techniques;Passive range of motion    PT Next Visit Plan functional LE strengthening, manual/flexibility to B hamstrings, stair progression    Consulted and Agree with Plan of Care Patient           Patient will benefit from skilled therapeutic intervention in order to improve the following deficits and impairments:  Abnormal gait,Decreased range of motion,Difficulty walking,Increased muscle spasms,Decreased activity tolerance,Pain,Impaired flexibility,Hypomobility,Decreased strength  Visit Diagnosis: Pain in right lower leg  Difficulty in walking, not elsewhere classified  Chronic bilateral low back pain without sciatica     Problem  List Patient Active Problem List   Diagnosis Date Noted  . Hyperlipidemia 11/19/2016  . Coronary artery disease of native artery of native heart with stable angina pectoris ([place] 06/02/2016  . Pure hypercholesterolemia 06/02/2016  . 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 ALLERGIC RHINITIS 04/03/2008  . ASTHMA 04/03/2008   PHYSICAL THERAPY DISCHARGE SUMMARY  Visits from Start of Care: 18  Plan: Patient agrees to discharge.  Patient goals were met. Patient is being discharged due to meeting the stated rehab goals.  ?????        AAmador Cunas PT, DPT ADonald ProseSugg 05/07/2021, 11:52 AM  [place] [address]Phone: [phone]  Fax:  [phone] Name: Janice RUSSMANMRN: [id number] of Birth: [birth year]

## 2021-12-17 ENCOUNTER — Other Ambulatory Visit: Payer: Self-pay | Admitting: Family Medicine

## 2021-12-23 ENCOUNTER — Other Ambulatory Visit: Payer: Self-pay | Admitting: Family Medicine

## 2021-12-23 DIAGNOSIS — N6320 Unspecified lump in the left breast, unspecified quadrant: Secondary | ICD-10-CM

## 2021-12-25 ENCOUNTER — Telehealth: Payer: Self-pay | Admitting: Cardiology

## 2021-12-25 NOTE — Telephone Encounter (Signed)
Spoke with pt who is reporting she feels the Diovan she takes for HTN is causing her to have diarrhea.  She reports she has had diarrhea since she was started on Diovan in 2020.  Recently (09/3121) Dr Cliffton Asters increased her Diovan to 320 mg daily and pt reports diarrhea is twice as bad.  Pt also take Metformin and she stopped taking it for a week to see if there was any improvement.  There was not. She reports she spoke with Dr Cliffton Asters about it and was told "we will figure it out" but nothing has been done.   Advised I will have Dr Anne Fu to review and c/b with any new orders.

## 2021-12-25 NOTE — Telephone Encounter (Signed)
Pt c/o medication issue:  1. Name of Medication: valsartan (DIOVAN) 160 MG tablet  2. How are you currently taking this medication (dosage and times per day)? Take 160 mg by mouth daily.  3. Are you having a reaction (difficulty breathing--STAT)? no  4. What is your medication issue? Patients states that the medication is giving her diarrhea. Please advise

## 2021-12-26 NOTE — Telephone Encounter (Signed)
Let's stop Diovan  Wait for one week to see if diarrhea resolves then start amlodipine 5mg  PO QD  , MD

## 2021-12-28 NOTE — Telephone Encounter (Signed)
Lm to cb to discuss medication change

## 2021-12-28 NOTE — Telephone Encounter (Signed)
Spoke with pt and advised to d/c Diovan for 1 week to see if diarrhea resolves.  She will c/b to make [place] aware if it has next week.  Will leave diovan on medication list for now until it is determined if her medication needs to be changed or not.

## 2022-01-08 NOTE — Telephone Encounter (Signed)
Lm for pt to c/b to discuss if being off Diovan has helped her GI issues.

## 2022-01-14 NOTE — Telephone Encounter (Signed)
Spoke with pt to f/u regarding diarrhea.  She stopped Diovan for 1 week without improvement of symptoms.  She has since restarted it.  She help Metformin for a few days and symptoms did improve.  Advised to discuss with PCP for possible medication change.  Pt will keep appt with MS as scheduled.

## 2022-01-22 ENCOUNTER — Other Ambulatory Visit: Payer: Self-pay

## 2022-01-22 ENCOUNTER — Ambulatory Visit
Admission: RE | Admit: 2022-01-22 | Discharge: 2022-01-22 | Disposition: A | Discharge status: Home / Self Care | Payer: Medicare Other | Source: Ambulatory Visit | Attending: Family Medicine | Admitting: Family Medicine

## 2022-01-22 DIAGNOSIS — N6320 Unspecified lump in the left breast, unspecified quadrant: Secondary | ICD-10-CM

## 2022-01-27 ENCOUNTER — Other Ambulatory Visit: Payer: Medicare Other

## 2022-03-01 ENCOUNTER — Emergency Department (HOSPITAL_COMMUNITY): Payer: Medicare Other

## 2022-03-01 ENCOUNTER — Emergency Department (HOSPITAL_COMMUNITY)
Admission: EM | Admit: 2022-03-01 | Discharge: 2022-03-02 | Disposition: A | Discharge status: Home / Self Care | Payer: Medicare Other | Attending: Emergency Medicine | Admitting: Emergency Medicine

## 2022-03-01 DIAGNOSIS — Z79899 Other long term (current) drug therapy: Secondary | ICD-10-CM | POA: Diagnosis not present

## 2022-03-01 DIAGNOSIS — F10129 Alcohol abuse with intoxication, unspecified: Secondary | ICD-10-CM | POA: Insufficient documentation

## 2022-03-01 DIAGNOSIS — Y906 Blood alcohol level of 120-199 mg/100 ml: Secondary | ICD-10-CM | POA: Diagnosis not present

## 2022-03-01 DIAGNOSIS — W19XXXA Unspecified fall, initial encounter: Secondary | ICD-10-CM | POA: Insufficient documentation

## 2022-03-01 DIAGNOSIS — Z20822 Contact with and (suspected) exposure to covid-19: Secondary | ICD-10-CM | POA: Diagnosis not present

## 2022-03-01 DIAGNOSIS — I10 Essential (primary) hypertension: Secondary | ICD-10-CM | POA: Diagnosis not present

## 2022-03-01 DIAGNOSIS — S0990XA Unspecified injury of head, initial encounter: Secondary | ICD-10-CM | POA: Diagnosis present

## 2022-03-01 DIAGNOSIS — F10920 Alcohol use, unspecified with intoxication, uncomplicated: Secondary | ICD-10-CM

## 2022-03-01 DIAGNOSIS — Z7984 Long term (current) use of oral hypoglycemic drugs: Secondary | ICD-10-CM | POA: Insufficient documentation

## 2022-03-01 DIAGNOSIS — Z7901 Long term (current) use of anticoagulants: Secondary | ICD-10-CM | POA: Insufficient documentation

## 2022-03-01 DIAGNOSIS — E119 Type 2 diabetes mellitus without complications: Secondary | ICD-10-CM | POA: Diagnosis not present

## 2022-03-01 DIAGNOSIS — J45909 Unspecified asthma, uncomplicated: Secondary | ICD-10-CM | POA: Diagnosis not present

## 2022-03-01 DIAGNOSIS — T1490XA Injury, unspecified, initial encounter: Secondary | ICD-10-CM

## 2022-03-01 LAB — SAMPLE TO BLOOD BANK

## 2022-03-01 LAB — I-STAT CHEM 8, ED
BUN: 29 mg/dL — ABNORMAL HIGH (ref 8–23)
Calcium, Ion: 1.06 mmol/L — ABNORMAL LOW (ref 1.15–1.40)
Chloride: 102 mmol/L (ref 98–111)
Creatinine, Ser: 1.3 mg/dL — ABNORMAL HIGH (ref 0.44–1.00)
Glucose, Bld: 264 mg/dL — ABNORMAL HIGH (ref 70–99)
HCT: 38 % (ref 36.0–46.0)
Hemoglobin: 12.9 g/dL (ref 12.0–15.0)
Potassium: 3.8 mmol/L (ref 3.5–5.1)
Sodium: 138 mmol/L (ref 135–145)
TCO2: 26 mmol/L (ref 22–32)

## 2022-03-01 LAB — PROTIME-INR
INR: 1 (ref 0.8–1.2)
Prothrombin Time: 13.2 seconds (ref 11.4–15.2)

## 2022-03-01 LAB — CBC
HCT: 39.6 % (ref 36.0–46.0)
Hemoglobin: 13.5 g/dL (ref 12.0–15.0)
MCH: 32.1 pg (ref 26.0–34.0)
MCHC: 34.1 g/dL (ref 30.0–36.0)
MCV: 94.3 fL (ref 80.0–100.0)
Platelets: 160 10*3/uL (ref 150–400)
RBC: 4.2 MIL/uL (ref 3.87–5.11)
RDW: 12.1 % (ref 11.5–15.5)
WBC: 5.8 10*3/uL (ref 4.0–10.5)
nRBC: 0 % (ref 0.0–0.2)

## 2022-03-01 LAB — RESP PANEL BY RT-PCR (FLU A&B, COVID) ARPGX2
Influenza A by PCR: NEGATIVE
Influenza B by PCR: NEGATIVE
SARS Coronavirus 2 by RT PCR: NEGATIVE

## 2022-03-01 LAB — COMPREHENSIVE METABOLIC PANEL
ALT: 23 U/L (ref 0–44)
AST: 24 U/L (ref 15–41)
Albumin: 3.7 g/dL (ref 3.5–5.0)
Alkaline Phosphatase: 49 U/L (ref 38–126)
Anion gap: 13 (ref 5–15)
BUN: 21 mg/dL (ref 8–23)
CO2: 23 mmol/L (ref 22–32)
Calcium: 9.1 mg/dL (ref 8.9–10.3)
Chloride: 102 mmol/L (ref 98–111)
Creatinine, Ser: 0.97 mg/dL (ref 0.44–1.00)
GFR, Estimated: 60 mL/min — ABNORMAL LOW (ref >60)
Glucose, Bld: 270 mg/dL — ABNORMAL HIGH (ref 70–99)
Potassium: 3.6 mmol/L (ref 3.5–5.1)
Sodium: 138 mmol/L (ref 135–145)
Total Bilirubin: 0.5 mg/dL (ref 0.3–1.2)
Total Protein: 6.6 g/dL (ref 6.5–8.1)

## 2022-03-01 LAB — CBG MONITORING, ED: Glucose-Capillary: 260 mg/dL — ABNORMAL HIGH (ref 70–99)

## 2022-03-01 LAB — ETHANOL: Alcohol, Ethyl (B): 174 mg/dL — ABNORMAL HIGH (ref <10)

## 2022-03-01 LAB — LACTIC ACID, PLASMA: Lactic Acid, Venous: 2.8 mmol/L (ref 0.5–1.9)

## 2022-03-01 NOTE — Progress Notes (Signed)
Orthopedic Tech Progress Note ?Patient Details:  ?Janice Mcdonald ?1941-12-13 ?[id number] ? ?Patient ID: Janice Mcdonald, female   DOB: [birth year], 81 y.o.   MRN: [id number] ?I attended trauma page. ?Karolee Stamps ?03/01/2022, 11:34 PM ? ?

## 2022-03-01 NOTE — ED Notes (Signed)
CT called, no scanners available, CT to call once a scanner is available to take her to  ?

## 2022-03-01 NOTE — Progress Notes (Signed)
?   03/01/22 2129  ?Clinical Encounter Type  ?Visited With Other (Comment)  ?Visit Type Trauma  ?Referral From Nurse  ?Consult/Referral To Chaplain  ? ?Chaplain Tery Sanfilippo responded to page. Spoke with Licensed conveyancer, chaplain support is not needed, advise chaplain remains available for support as needed. This note was prepared by Deneen Harts, M.Div..  For questions please contact by phone [phone].   ?

## 2022-03-01 NOTE — ED Triage Notes (Signed)
Pt BIB EMS for a fall on plavix. Pt found found on the floor face down by her husband, unknown down time, unknow if she hit her head. Pt A&Ox4 with EMS but sluggish and groggy. Pt admit to 2 glasses of whisky.  ? ?Hx Diabetes been of metformin for 2 weeks CBG 316 ? ? ?128/76 ?56 HR  ?98% RA  ?

## 2022-03-01 NOTE — ED Provider Notes (Incomplete)
[facility] EMERGENCY DEPARTMENT Provider Note   CSN: [id number] Arrival date & time: 03/01/22  2249     History {Add pertinent medical, surgical, social history, OB history to HPI:1} Chief Complaint  Patient presents with   level 2 fall     Janice Mcdonald is a 81 y.o. female.  HPI     This is a 81 year old female who presents after a fall.  Patient takes Plavix.  Per patient's husband he heard her fall and she called out for him.  He believes that she was reaching down to get something out of a bag in her bedroom.  Patient does not recall the fall.  Husband reports that she drank 2 liquor drinks earlier this evening.  That is not abnormal for her.  Patient denies any pain.  She was not ambulatory on scene.  Patient is somnolent but arousable.  Husband provides most of the history.  Home Medications Prior to Admission medications   Medication Sig Start Date End Date Taking? Authorizing Provider  calcium carbonate (OS-CAL) 600 MG TABS Take 600 mg by mouth daily.    [provider]  carvedilol (COREG) 12.5 MG tablet Take 1 tablet (12.5 mg total) by mouth 2 (two) times daily. 01/04/17   Jake Bathe, MD  Cholecalciferol (VITAMIN D) 1000 UNITS capsule Take 2,000 Units by mouth daily.    [provider]  clopidogrel (PLAVIX) 75 MG tablet TAKE 1 TABLET BY MOUTH DAILY 04/15/21   Jake Bathe, MD  Coenzyme Q10 (COQ10) 100 MG CAPS Take 1 capsule by mouth daily.    [provider]  ezetimibe (ZETIA) 10 MG tablet TAKE 1 TABLET BY MOUTH DAILY 05/04/21   Jake Bathe, MD  fluticasone (FLONASE) 50 MCG/ACT nasal spray Place 1 spray into both nostrils daily as needed for allergies. As needed 10/28/11   [provider]  metFORMIN (GLUCOPHAGE) 500 MG tablet Take 500 mg by mouth daily. 09/05/17   [provider]  Multiple Vitamins-Minerals (MULTIVITAMIN PO) Take 1 tablet by mouth daily.     [provider]  nitroGLYCERIN  (NITROSTAT) 0.4 MG SL tablet Place 0.4 mg under the tongue every 5 (five) minutes as needed for chest pain.    [provider]  pravastatin (PRAVACHOL) 20 MG tablet TAKE 1 TABLET BY MOUTH DAILY IN THE EVENING 04/15/21   Jake Bathe, MD  valsartan (DIOVAN) 160 MG tablet Take 160 mg by mouth daily. 01/16/19   [provider]  vitamin C (ASCORBIC ACID) 500 MG tablet Take 500 mg by mouth daily.    [provider]      Allergies    Symmetrel [amantadine], Crestor [rosuvastatin], and Singulair [montelukast]    Review of Systems   Review of Systems  Respiratory:  Negative for shortness of breath.   Cardiovascular:  Negative for chest pain.  Musculoskeletal:  Negative for neck pain.  Neurological:  Negative for headaches.  All other systems reviewed and are negative.  Physical Exam Updated Vital Signs BP 117/65    Pulse (!) 59    Resp 15    Ht 1.727 m (5\' 8" )    Wt 97.5 kg    LMP  (LMP Unknown)    SpO2 98%    BMI 32.69 kg/m  Physical Exam Vitals and nursing note reviewed.  Constitutional:      Appearance: She is well-developed. She is obese. She is not ill-appearing.  HENT:     Head: Normocephalic and atraumatic.  Nose: Nose normal.     Mouth/Throat:     Mouth: Mucous membranes are moist.  Eyes:     Pupils: Pupils are equal, round, and reactive to light.     Comments: Pupils 3 and reactive bilaterally  Neck:     Comments: C-collar in place Cardiovascular:     Rate and Rhythm: Normal rate and regular rhythm.     Heart sounds: Normal heart sounds.  Pulmonary:     Effort: Pulmonary effort is normal. No respiratory distress.     Breath sounds: No wheezing.  Abdominal:     Palpations: Abdomen is soft.  Musculoskeletal:     Comments: No obvious deformities, moves all 4 extremities, no tenderness to palpation  Skin:    General: Skin is warm and dry.  Neurological:     Mental Status: She is alert.     Comments: Oriented to person and place, not  time Somnolent but arousable  Psychiatric:     Comments: Unable to assess    ED Results / Procedures / Treatments   Labs (all labs ordered are listed, but only abnormal results are displayed) Labs Reviewed  I-STAT CHEM 8, ED - Abnormal; Notable for the following components:      Result Value   BUN 29 (*)    Creatinine, Ser 1.30 (*)    Glucose, Bld 264 (*)    Calcium, Ion 1.06 (*)    All other components within normal limits  CBG MONITORING, ED - Abnormal; Notable for the following components:   Glucose-Capillary 260 (*)    All other components within normal limits  RESP PANEL BY RT-PCR (FLU A&B, COVID) ARPGX2  CBC  COMPREHENSIVE METABOLIC PANEL  ETHANOL  URINALYSIS, ROUTINE W REFLEX MICROSCOPIC  LACTIC ACID, PLASMA  PROTIME-INR  SAMPLE TO BLOOD BANK    EKG EKG Interpretation  Date/Time:  Monday March 01 2022 23:01:19 EDT Ventricular Rate:  60 PR Interval:  214 QRS Duration: 103 QT Interval:  448 QTC Calculation: 448 R Axis:   -25 Text Interpretation: Sinus rhythm Borderline prolonged PR interval Borderline left axis deviation Abnormal R-wave progression, late transition Confirmed by Ross Marcus ([id number]) on 03/01/2022 11:09:12 PM  Radiology DG Pelvis Portable  Result Date: 03/01/2022 CLINICAL DATA:  Trauma. EXAM: PORTABLE PELVIS 1-2 VIEWS COMPARISON:  None. FINDINGS: Evaluation is limited due to body habitus. There is no acute fracture or dislocation. The bones are osteopenic. Mild to moderate bilateral hip arthritic changes. The soft tissues are grossly unremarkable. IMPRESSION: No acute fracture or dislocation. Electronically Signed   By: Elgie Collard M.D.   On: 03/01/2022 23:27   DG Chest Port 1 View  Result Date: 03/01/2022 CLINICAL DATA:  Trauma. EXAM: PORTABLE CHEST 1 VIEW COMPARISON:  None. FINDINGS: Heart is enlarged and the mediastinal contour is within normal limits. The pulmonary vasculature is mildly distended. Patchy airspace disease is noted at the  lung bases bilaterally. No definite effusion or pneumothorax. No acute osseous abnormality. IMPRESSION: 1. Patchy airspace disease at the lung bases, possible atelectasis or infiltrate. 2. Mild cardiomegaly. Electronically Signed   By: Thornell Sartorius M.D.   On: 03/01/2022 23:28    Procedures Procedures  {Document cardiac monitor, telemetry assessment procedure when appropriate:1}  Medications Ordered in ED Medications - No data to display  ED Course/ Medical Decision Making/ A&P                           Medical Decision Making  ***  {  Document critical care time when appropriate:1} {Document review of labs and clinical decision tools ie heart score, Chads2Vasc2 etc:1}  {Document your independent review of radiology images, and any outside records:1} {Document your discussion with family members, caretakers, and with consultants:1} {Document social determinants of health affecting pt's care:1} {Document your decision making why or why not admission, treatments were needed:1} Final Clinical Impression(s) / ED Diagnoses Final diagnoses:  Trauma    Rx / DC Orders ED Discharge Orders     None

## 2022-03-02 LAB — URINALYSIS, ROUTINE W REFLEX MICROSCOPIC
Bilirubin Urine: NEGATIVE
Glucose, UA: 150 mg/dL — AB
Hgb urine dipstick: NEGATIVE
Ketones, ur: NEGATIVE mg/dL
Leukocytes,Ua: NEGATIVE
Nitrite: NEGATIVE
Protein, ur: NEGATIVE mg/dL
Specific Gravity, Urine: 1.008 (ref 1.005–1.030)
pH: 7 (ref 5.0–8.0)

## 2022-03-02 MED ORDER — SODIUM CHLORIDE 0.9 % IV BOLUS
1000.0000 mL | Freq: Once | INTRAVENOUS | Status: AC
  Administered 2022-03-02: 1000 mL via INTRAVENOUS

## 2022-03-02 NOTE — Discharge Instructions (Signed)
You were seen or seen today for a fall.  Luckily, you do not have any obvious injuries.  This may have been related to drinking.  You should avoid binge drinking especially at your age as this will increase her fall risk.  You are at high risk for injury because you take Plavix. ?

## 2022-03-02 NOTE — ED Notes (Signed)
[name] [phone] ?

## 2022-03-02 NOTE — ED Notes (Signed)
Pt able to ambulate around the room with no assistance ?

## 2022-04-13 ENCOUNTER — Ambulatory Visit (INDEPENDENT_AMBULATORY_CARE_PROVIDER_SITE_OTHER): Payer: Medicare Other | Admitting: Cardiology

## 2022-04-13 VITALS — BP 140/80 | HR 56 | Ht 68.0 in | Wt 221.0 lb

## 2022-04-13 DIAGNOSIS — I25118 Atherosclerotic heart disease of native coronary artery with other forms of angina pectoris: Secondary | ICD-10-CM | POA: Diagnosis not present

## 2022-04-13 DIAGNOSIS — Z79899 Other long term (current) drug therapy: Secondary | ICD-10-CM

## 2022-04-13 DIAGNOSIS — I10 Essential (primary) hypertension: Secondary | ICD-10-CM | POA: Diagnosis not present

## 2022-04-13 DIAGNOSIS — E7800 Pure hypercholesterolemia, unspecified: Secondary | ICD-10-CM | POA: Diagnosis not present

## 2022-04-13 MED ORDER — HYDROCHLOROTHIAZIDE 12.5 MG PO CAPS: 12.5000 mg | ORAL_CAPSULE | Freq: Every day | ORAL | 3 refills | Status: AC

## 2022-04-13 NOTE — Assessment & Plan Note (Signed)
Has been on Zetia and pravastatin.  Prior LDL down to 75 from 217.  She had been taking PCSK9 inhibitor Repatha in the past in [name] when her doctor was giving her samples.  She has previously discussed this with our lipid clinic.  For now continuing with Zetia and pravastatin.  LDL goal less than 70. ?

## 2022-04-13 NOTE — Assessment & Plan Note (Addendum)
Dr. Larena Glassman increased her valsartan to 360 mg once a day.  Previously had increased her carvedilol to 12.5 mg twice a day.  She shows a several blood pressure readings ranging from [id number] systolic.  Still has room for improvement.  We will go ahead and start HCTZ 12.5 mg once a day.  Continue to monitor.  We will go ahead and send her to the hypertension clinic as well pharmacy lead to help her. ?

## 2022-04-13 NOTE — Assessment & Plan Note (Signed)
Proximal LAD stent placed in June 2017.  Overall doing well on Plavix monotherapy.  No anginal symptoms currently on medications.  Excellent. ?

## 2022-04-13 NOTE — Patient Instructions (Signed)
Medication Instructions:  ?Please start Hydrochlorothiazide 12.5 mg once daily. ?Continue all other medications as listed. ? ?*If you need a refill on your cardiac medications before your next appointment, please call your pharmacy* ? ? ?Follow-Up: ?At [facility], you and your health needs are our priority.  As part of our continuing mission to provide you with exceptional heart care, we have created designated Provider Care Teams.  These Care Teams include your primary Cardiologist (physician) and Advanced Practice Providers (APPs -  Physician Assistants and Nurse Practitioners) who all work together to provide you with the care you need, when you need it. ? ?We recommend signing up for the patient portal called "MyChart".  Sign up information is provided on this After Visit Summary.  MyChart is used to connect with patients for Virtual Visits (Telemedicine).  Patients are able to view lab/test results, encounter notes, upcoming appointments, etc.  Non-urgent messages can be sent to your provider as well.   ?To learn more about what you can do with MyChart, go to [email or web address].   ? ?Your next appointment:   ?1 month(s) ? ?The format for your next appointment:   ?In Person ? ?Provider:  Hypertension Clinic ? ?If primary card or EP is not listed click here to update    :1}  ? ? ?Important Information About Sugar ? ? ? ? ?  ?
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
## 2022-04-20 IMAGING — MG MM DIGITAL SCREENING BILAT W/ TOMO AND CAD
6 of 12 series · 6 of 36 positions shown · non-contrast
Comparison: Previous exam(s).

CLINICAL DATA: Screening.

EXAM:
DIGITAL SCREENING BILATERAL MAMMOGRAM WITH TOMO AND CAD

[L MLO synth-2D (1 of 2)]
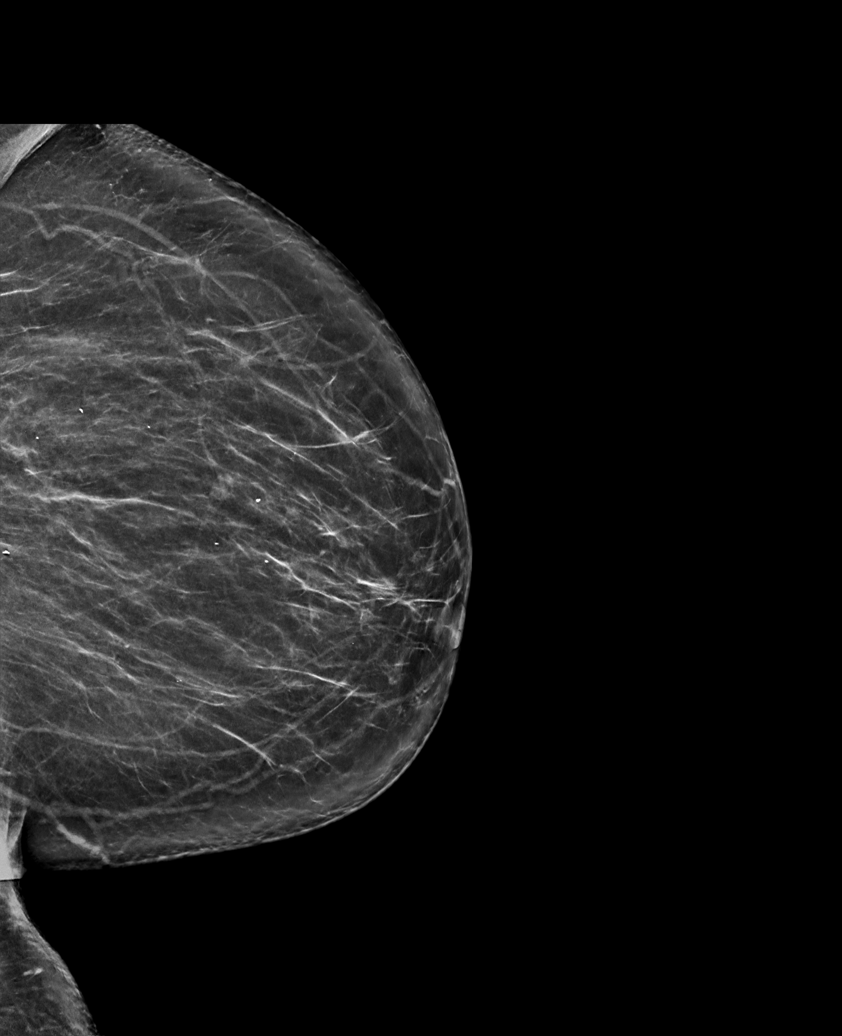

[R MLO synth-2D (1 of 2)]
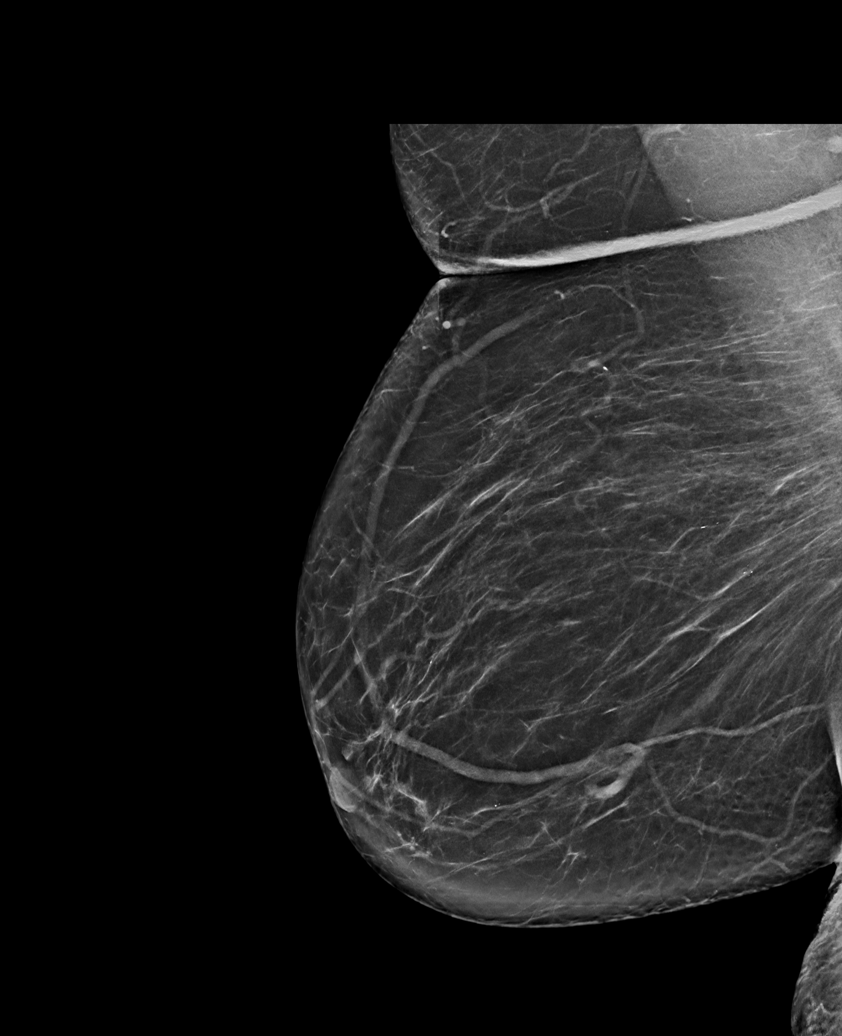

[R CC synth-2D]
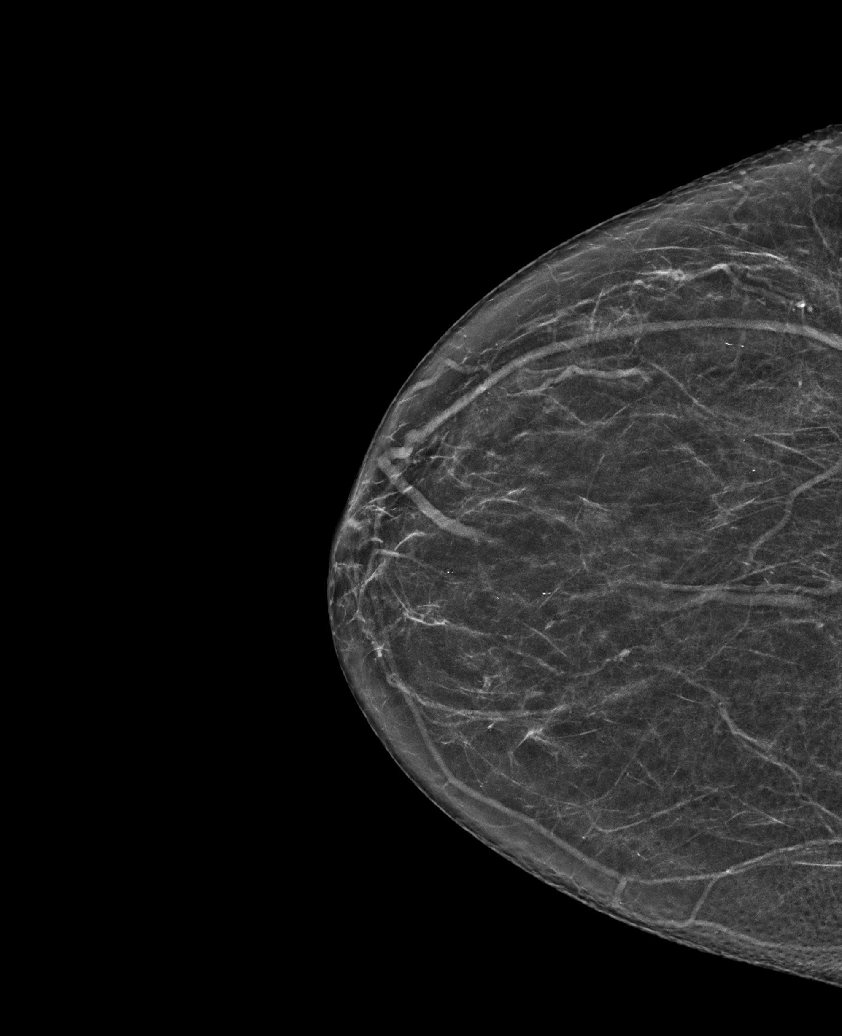

[R MLO synth-2D (2 of 2)]
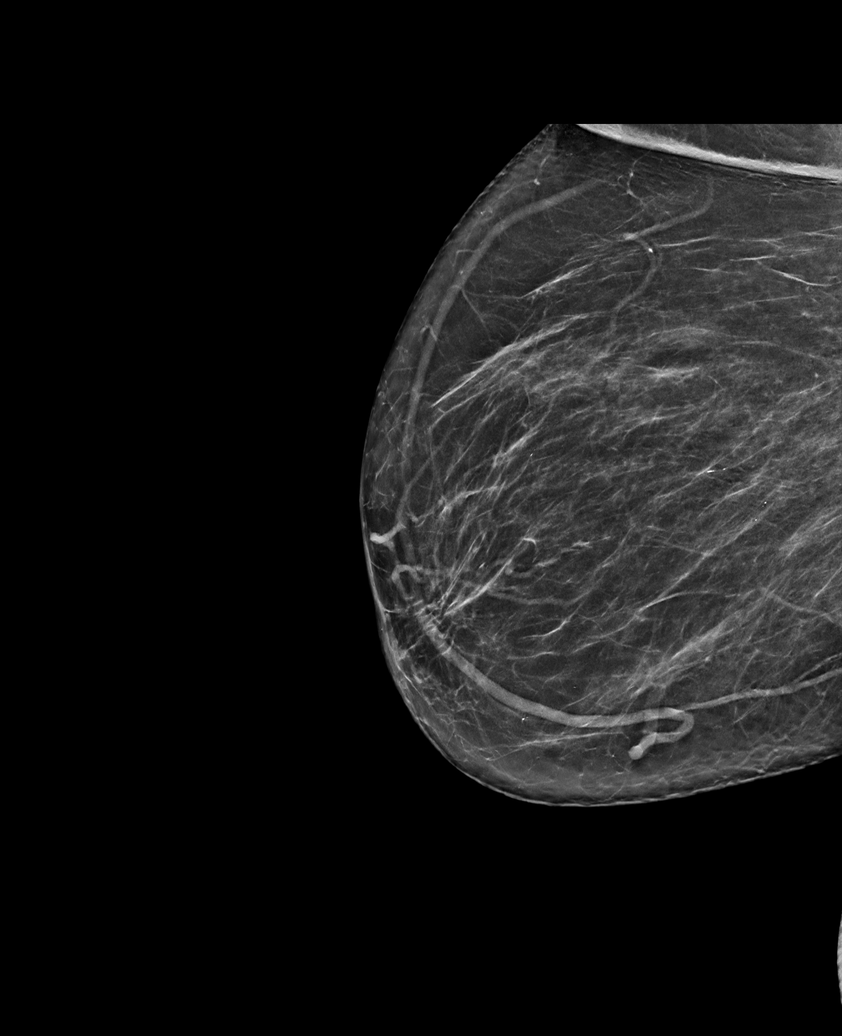

[L CC synth-2D]
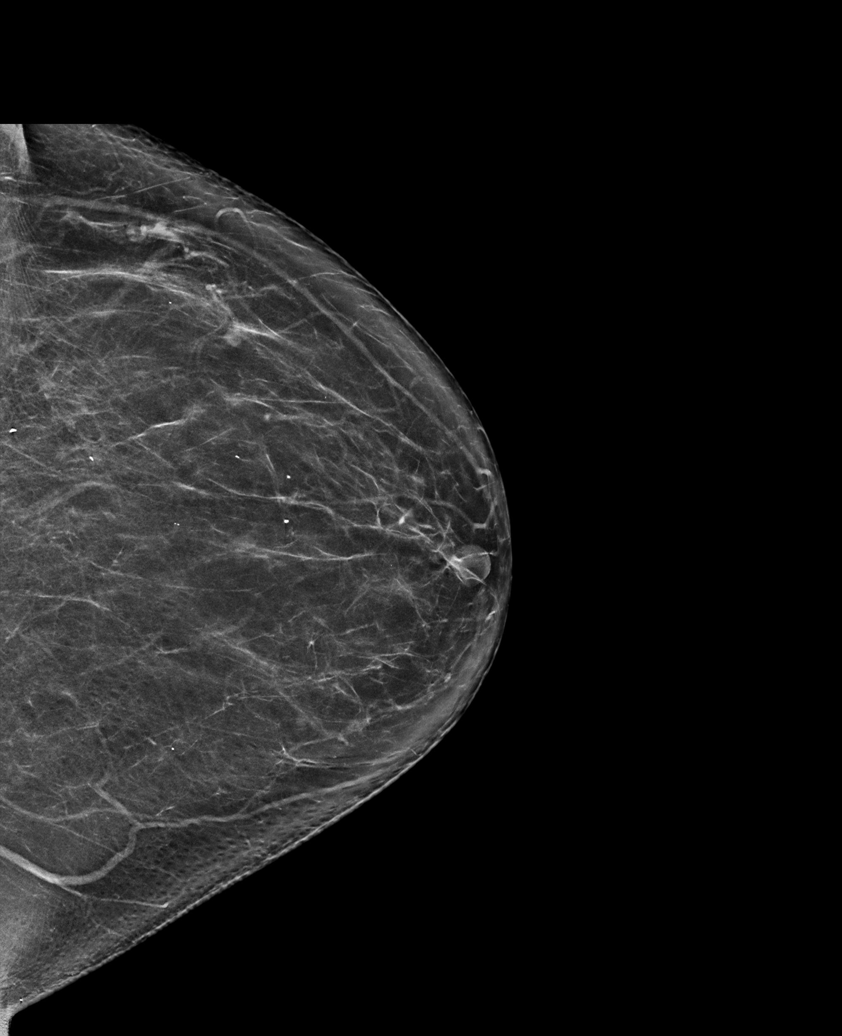

[L MLO synth-2D (2 of 2)]
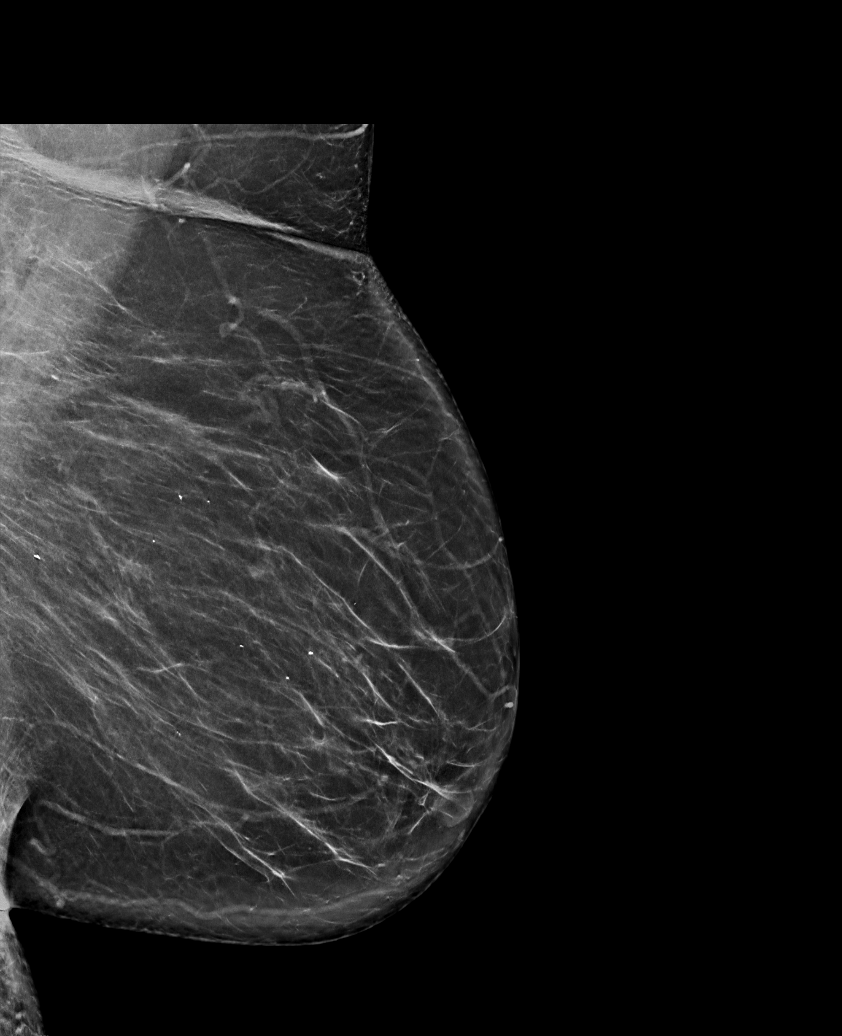

[6 of 36 positions shown; findings below may reference images not displayed]

ACR Breast Density Category b: There are scattered areas of
fibroglandular density.
FINDINGS: There are no findings suspicious for malignancy. The images were
evaluated with computer-aided detection.
IMPRESSION: No mammographic evidence of malignancy. A result letter of this
screening mammogram will be mailed directly to the patient.

RECOMMENDATION:
Screening mammogram in one year. (Code:ZP-7-VX7)

BI-RADS CATEGORY  1: Negative.

## 2022-04-26 ENCOUNTER — Other Ambulatory Visit: Payer: Self-pay | Admitting: Cardiology
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
## 2022-05-13 ENCOUNTER — Ambulatory Visit (INDEPENDENT_AMBULATORY_CARE_PROVIDER_SITE_OTHER): Payer: Medicare Other | Admitting: Pharmacist

## 2022-05-13 VITALS — BP 122/66

## 2022-05-13 DIAGNOSIS — I10 Essential (primary) hypertension: Secondary | ICD-10-CM

## 2022-05-13 DIAGNOSIS — E7800 Pure hypercholesterolemia, unspecified: Secondary | ICD-10-CM

## 2022-05-13 LAB — BASIC METABOLIC PANEL
BUN/Creatinine Ratio: 19 (ref 12–28)
BUN: 21 mg/dL (ref 8–27)
CO2: 24 mmol/L (ref 20–29)
Calcium: 10.2 mg/dL (ref 8.7–10.3)
Chloride: 96 mmol/L (ref 96–106)
Creatinine, Ser: 1.08 mg/dL — ABNORMAL HIGH (ref 0.57–1.00)
Glucose: 250 mg/dL — ABNORMAL HIGH (ref 70–99)
Potassium: 4.3 mmol/L (ref 3.5–5.2)
Sodium: 135 mmol/L (ref 134–144)
eGFR: 53 mL/min/{1.73_m2} — ABNORMAL LOW (ref >59)

## 2022-05-13 NOTE — Patient Instructions (Signed)
It was great to see you today!  Continue taking valsartan 320 mg daily, carvedilol 12.5 mg twice daily, hydrochlorothiazide 12.5 mg daily  Your blood pressure goal is less than 130/80. Your blood pressure today was less than 122/66.   Let us know if you have any questions or concerns!

## 2022-05-21 ENCOUNTER — Other Ambulatory Visit: Payer: Self-pay

## 2022-05-21 MED ORDER — EZETIMIBE 10 MG PO TABS: 10.0000 mg | ORAL_TABLET | Freq: Every day | ORAL | 3 refills | Status: AC

## 2022-12-09 ENCOUNTER — Other Ambulatory Visit: Payer: Self-pay | Admitting: Family Medicine

## 2022-12-09 DIAGNOSIS — Z1231 Encounter for screening mammogram for malignant neoplasm of breast: Secondary | ICD-10-CM

## 2023-02-01 ENCOUNTER — Ambulatory Visit
Admission: RE | Admit: 2023-02-01 | Discharge: 2023-02-01 | Disposition: A | Discharge status: Home / Self Care | Payer: Medicare Other | Source: Ambulatory Visit | Attending: Family Medicine | Admitting: Family Medicine

## 2023-02-01 DIAGNOSIS — Z1231 Encounter for screening mammogram for malignant neoplasm of breast: Secondary | ICD-10-CM

## 2023-03-22 ENCOUNTER — Other Ambulatory Visit: Payer: Self-pay | Admitting: Family Medicine

## 2023-03-22 DIAGNOSIS — H918X3 Other specified hearing loss, bilateral: Secondary | ICD-10-CM

## 2023-04-01 ENCOUNTER — Other Ambulatory Visit: Payer: Self-pay | Admitting: Family Medicine

## 2023-04-01 DIAGNOSIS — M8580 Other specified disorders of bone density and structure, unspecified site: Secondary | ICD-10-CM

## 2023-04-02 ENCOUNTER — Ambulatory Visit
Admission: RE | Admit: 2023-04-02 | Discharge: 2023-04-02 | Disposition: A | Discharge status: Home / Self Care | Payer: Medicare Other | Source: Ambulatory Visit | Attending: Family Medicine | Admitting: Family Medicine

## 2023-04-02 DIAGNOSIS — H918X3 Other specified hearing loss, bilateral: Secondary | ICD-10-CM

## 2023-04-02 MED ORDER — GADOPICLENOL 0.5 MMOL/ML IV SOLN
10.0000 mL | Freq: Once | INTRAVENOUS | Status: AC | PRN
  Administered 2023-04-02: 10 mL via INTRAVENOUS

## 2023-04-05 ENCOUNTER — Other Ambulatory Visit: Payer: Self-pay | Admitting: Family Medicine

## 2023-04-05 DIAGNOSIS — E2839 Other primary ovarian failure: Secondary | ICD-10-CM

## 2023-04-05 DIAGNOSIS — M8580 Other specified disorders of bone density and structure, unspecified site: Secondary | ICD-10-CM

## 2023-04-07 ENCOUNTER — Other Ambulatory Visit: Payer: Self-pay | Admitting: Cardiology

## 2023-04-08 ENCOUNTER — Ambulatory Visit: Payer: Medicare Other | Admitting: Cardiology

## 2023-04-11 ENCOUNTER — Ambulatory Visit: Payer: Medicare Other | Attending: Cardiology | Admitting: Cardiology

## 2023-04-11 ENCOUNTER — Encounter: Payer: Self-pay | Admitting: Cardiology

## 2023-04-11 ENCOUNTER — Ambulatory Visit: Payer: Medicare Other | Admitting: Cardiology

## 2023-04-11 VITALS — BP 140/80 | HR 64 | Ht 68.0 in | Wt 210.6 lb

## 2023-04-11 DIAGNOSIS — I10 Essential (primary) hypertension: Secondary | ICD-10-CM | POA: Diagnosis not present

## 2023-04-11 DIAGNOSIS — E7800 Pure hypercholesterolemia, unspecified: Secondary | ICD-10-CM | POA: Diagnosis not present

## 2023-04-11 DIAGNOSIS — I25118 Atherosclerotic heart disease of native coronary artery with other forms of angina pectoris: Secondary | ICD-10-CM | POA: Diagnosis not present

## 2023-04-11 MED ORDER — CLOPIDOGREL BISULFATE 75 MG PO TABS: 75.0000 mg | ORAL_TABLET | Freq: Every day | ORAL | 3 refills | Status: DC

## 2023-04-11 NOTE — Patient Instructions (Signed)
Please monitor your blood pressure at home and let us know if systolic is consistently above 140.  Medication Instructions:  Your physician recommends that you continue on your current medications as directed. Please refer to the Current Medication list given to you today.  *If you need a refill on your cardiac medications before your next appointment, please call your pharmacy*  Follow-Up: At [facility], you and your health needs are our priority.  As part of our continuing mission to provide you with exceptional heart care, we have created designated Provider Care Teams.  These Care Teams include your primary Cardiologist (physician) and Advanced Practice Providers (APPs -  Physician Assistants and Nurse Practitioners) who all work together to provide you with the care you need, when you need it.   Your next appointment:   1 year(s)  Provider:   Donato Schultz, MD
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
## 2023-04-11 NOTE — Addendum Note (Signed)
Addended by: Frutoso Schatz on: 04/11/2023 09:54 AM   Modules accepted: Orders

## 2023-10-24 ENCOUNTER — Ambulatory Visit
Admission: RE | Admit: 2023-10-24 | Discharge: 2023-10-24 | Disposition: A | Discharge status: Home / Self Care | Payer: Medicare Other | Source: Ambulatory Visit | Attending: Family Medicine | Admitting: Family Medicine

## 2023-10-24 DIAGNOSIS — M8580 Other specified disorders of bone density and structure, unspecified site: Secondary | ICD-10-CM

## 2023-10-24 DIAGNOSIS — E2839 Other primary ovarian failure: Secondary | ICD-10-CM

## 2023-12-02 ENCOUNTER — Other Ambulatory Visit: Payer: Self-pay | Admitting: *Deleted

## 2023-12-02 DIAGNOSIS — I878 Other specified disorders of veins: Secondary | ICD-10-CM

## 2023-12-09 ENCOUNTER — Ambulatory Visit (INDEPENDENT_AMBULATORY_CARE_PROVIDER_SITE_OTHER): Payer: Medicare Other | Admitting: Physician Assistant

## 2023-12-09 ENCOUNTER — Ambulatory Visit (HOSPITAL_COMMUNITY)
Admission: RE | Admit: 2023-12-09 | Discharge: 2023-12-09 | Disposition: A | Discharge status: Home / Self Care | Payer: Medicare Other | Source: Ambulatory Visit | Attending: Vascular Surgery | Admitting: Vascular Surgery

## 2023-12-09 VITALS — BP 126/82 | HR 72 | Temp 97.7°F | Resp 20 | Ht 68.0 in | Wt 190.4 lb

## 2023-12-09 DIAGNOSIS — I878 Other specified disorders of veins: Secondary | ICD-10-CM | POA: Insufficient documentation

## 2023-12-09 DIAGNOSIS — I83812 Varicose veins of left lower extremities with pain: Secondary | ICD-10-CM | POA: Diagnosis not present

## 2023-12-09 DIAGNOSIS — I872 Venous insufficiency (chronic) (peripheral): Secondary | ICD-10-CM

## 2023-12-09 NOTE — Progress Notes (Signed)
Requested by:  Hurman Horn, MD [phone] [address] [place],  [name] [address]  Reason for consultation: varicose veins    History of Present Illness   Janice Mcdonald is a 82 y.o. (20-Mar-1941) female who presents for evaluation of left lower extremity varicose veins.  She is unsure how long she has had varicose veins around her left calf, probably at least a couple of years.  She states these veins will occasionally cause a very sharp stabbing pain in her calf when walking.  She noticed one day several weeks ago that her varicose veins were fairly dilated and she was concerned about them.  She also endorses intermittent bilateral ankle swelling when taking 10+ hour car rides to [place].  She does wear compression stockings on her long car rides and this does help her ankle swelling.  She also occasionally elevates her legs in a recliner.  She denies any prior history of DVT or ulcerations.  She denies any prior vein procedures.  Past Medical History:  Diagnosis Date   ALLERGIC RHINITIS    Asthma    Diabetes mellitus without complication (HCC)    Hyperlipidemia    Hypertension    Osteoarthritis     History reviewed. No pertinent surgical history.  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  Tobacco Use   Smoking status: Never   Smokeless tobacco: Never  Vaping Use   Vaping status: Never Used  Substance and Sexual Activity   Alcohol use: Yes    Alcohol/week: 2.0 standard drinks of alcohol    Types: 2 Glasses of wine per week    Comment: DRINKS TWICE A  MONTH    Drug use: No   Sexual activity: Not on file  Other Topics Concern   Not on file  Social History Narrative   Daughter is an anesthesiologist at [employer]   Financial Resource Strain: Not on file  Food Insecurity: Not on file  Transportation Needs: Not on file  Physical Activity: Not on file  Stress: Not on file  Social Connections: Not on file  Intimate Partner Violence: Not on file    Family History  Problem Relation Age of Onset   Pneumonia Mother    Heart attack Father    Breast cancer Neg Hx     Current Outpatient Medications  Medication Sig Dispense Refill   calcium carbonate (OS-CAL) 600 MG TABS Take 600 mg by mouth daily.     carvedilol (COREG) 12.5 MG tablet Take 1 tablet (12.5 mg total) by mouth 2 (two) times daily. 180 tablet 2   Cholecalciferol (VITAMIN D) 1000 UNITS capsule Take 2,000 Units by mouth daily.     clopidogrel (PLAVIX) 75 MG tablet Take 1 tablet (75 mg total) by mouth daily. 90 tablet 3   Coenzyme Q10 (COQ10) 100 MG CAPS Take 1 capsule by mouth daily.     ezetimibe (ZETIA) 10 MG tablet Take 1 tablet (10 mg total) by mouth daily. 90 tablet 3   fluticasone (FLONASE) 50 MCG/ACT nasal spray Place 1 spray into both nostrils daily as needed for allergies. As needed     hydrochlorothiazide (MICROZIDE) 12.5 MG capsule Take 1 capsule (12.5 mg  total) by mouth daily. 90 capsule 3   meclizine (ANTIVERT) 25 MG tablet Take 25 mg by mouth 2 (two) times daily as needed for dizziness.     metFORMIN (GLUCOPHAGE) 500 MG tablet Take 500 mg by mouth daily.     Multiple Vitamins-Minerals (MULTIVITAMIN PO) Take 1 tablet by mouth daily.      nitroGLYCERIN (NITROSTAT) 0.4 MG SL tablet Place 0.4 mg under the tongue every 5 (five) minutes as needed for chest pain.     pravastatin (PRAVACHOL) 20 MG tablet TAKE 1 TABLET BY MOUTH DAILY IN THE EVENING 90 tablet 3   traZODone (DESYREL) 50 MG tablet Take 50 mg by mouth at bedtime.     valsartan (DIOVAN) 320 MG tablet Take 320 mg by mouth daily.     vitamin C (ASCORBIC ACID) 500 MG tablet Take 500 mg by mouth daily.     No current facility-administered medications for this  visit.    Allergies  Allergen Reactions   Symmetrel [Amantadine] Anxiety   Crestor [Rosuvastatin] Other (See Comments)    LEG CRAMPS   Singulair [Montelukast]     NOT EFFECTIVE    REVIEW OF SYSTEMS (negative unless checked):   Cardiac:  []  Chest pain or chest pressure? []  Shortness of breath upon activity? []  Shortness of breath when lying flat? []  Irregular heart rhythm?  Vascular:  [x]  Pain in calf, thigh, or hip brought on by walking? []  Pain in feet at night that wakes you up from your sleep? []  Blood clot in your veins? [x]  Leg swelling?  Pulmonary:  []  Oxygen at home? []  Productive cough? []  Wheezing?  Neurologic:  []  Sudden weakness in arms or legs? []  Sudden numbness in arms or legs? []  Sudden onset of difficult speaking or slurred speech? []  Temporary loss of vision in one eye? []  Problems with dizziness?  Gastrointestinal:  []  Blood in stool? []  Vomited blood?  Genitourinary:  []  Burning when urinating? []  Blood in urine?  Psychiatric:  []  Major depression  Hematologic:  []  Bleeding problems? []  Problems with blood clotting?  Dermatologic:  []  Rashes or ulcers?  Constitutional:  []  Fever or chills?  Ear/Nose/Throat:  []  Change in hearing? []  Nose bleeds? []  Sore throat?  Musculoskeletal:  []  Back pain? []  Joint pain? []  Muscle pain?   Physical Examination     Vitals:   12/09/23 1154  BP: 126/82  Pulse: 72  Resp: 20  Temp: 97.7 F (36.5 C)  TempSrc: Temporal  SpO2: 96%  Weight: 190 lb 6.4 oz (86.4 kg)  Height: 5\' 8"  (1.727 m)   Body mass index is 28.95 kg/m.  General:  WDWN in NAD; vital signs documented above Gait: Not observed HENT: WNL, normocephalic Pulmonary: normal non-labored breathing , without Rales, rhonchi,  wheezing Cardiac: regular Abdomen: soft, NT, no masses Skin: without rashes Vascular Exam/Pulses: 1+ pedal pulses  Extremities: no edema, stasis pigmentation, or ulcerations.  Left lower extremity  with small reticular veins around the anterior ankle.  Small varicose veins at the mid calf  Musculoskeletal: no muscle wasting or atrophy  Neurologic: A&O X 3;  No focal weakness or paresthesias are detected Psychiatric:  The pt has Normal affect.  Non-invasive Vascular Imaging   LLE Venous Insufficiency Duplex (12/09/2023):  +--------------+---------+------+-----------+------------+--------+  LEFT         Reflux NoRefluxReflux TimeDiameter cmsComments                          Yes                                   +--------------+---------+------+-----------+------------+--------+  CFV                    yes   >1 second                       +--------------+---------+------+-----------+------------+--------+  FV mid                  yes   >1 second                       +--------------+---------+------+-----------+------------+--------+  Popliteal              yes   >1 second                       +--------------+---------+------+-----------+------------+--------+  GSV at [facility]              yes    >500 ms      0.67              +--------------+---------+------+-----------+------------+--------+  GSV prox thigh          yes    >500 ms      0.75              +--------------+---------+------+-----------+------------+--------+  GSV mid thigh           yes    >500 ms      0.48              +--------------+---------+------+-----------+------------+--------+  GSV dist thigh          yes    >500 ms      0.45              +--------------+---------+------+-----------+------------+--------+  GSV at knee   no                            0.26              +--------------+---------+------+-----------+------------+--------+  GSV prox calf           yes    >500 ms      0.19              +--------------+---------+------+-----------+------------+--------+  GSV mid calf  no                            0.21               +--------------+---------+------+-----------+------------+--------+  SSV Pop Fossa no                            0.17              +--------------+---------+------+-----------+------------+--------+  SSV prox calf           yes    >500 ms      0.20              +--------------+---------+------+-----------+------------+--------+  SSV mid calf  no                            0.24              +--------------+---------+------+-----------+------------+--------+  AASV O                  yes    >500 ms  0.30              +--------------+---------+------+-----------+------------+--------+  AASV P        no                            0.25              +--------------+---------+------+-----------+------------+--------+  AASV M        no                            0.22              +--------------+---------+------+-----------+------------+--------+   Medical Decision Making   Janice Mcdonald is a 82 y.o. female who presents for evaluation of varicose veins  Based on the patient's duplex, there is reflux in the left common femoral vein, femoral vein, and popliteal vein.  There is also reflux in the greater saphenous vein from the junction to the distal thigh.  There is also reflux in the small saphenous vein at the proximal calf and in the AASV.  Given that her lower extremity swelling is fairly mild, I do not think she requires venous ablation. She describes intermittent bilateral ankle swelling after prolonged car rides.  She also has occasional sharp stabbing pains in her left calf at the site of some clustered varicose veins I described to the patient that she does have venous insufficiency, which can cause issues with lower extremity swelling, varicose veins, and reticular veins.  I explained that prolonged periods of sitting and standing and immobility can make her leg swelling worse and can also make her varicose veins more dilated.  I reassured her  that her venous insufficiency does not pose a threat to her limb or life. I believe her stabbing pains in the left calf are due to symptomatic varicose veins.  Her symptoms should be well-controlled with more regular use of knee-high compression stockings.  I also encouraged her to elevate her legs daily above her heart for at least 15 to 20 minutes She can follow-up with our office as needed   Ernestene Mention, PA-C [facility] Office: [phone]  12/09/2023, 1:09 PM  Clinic MD: Hetty Blend

## 2023-12-22 ENCOUNTER — Encounter: Payer: Self-pay | Admitting: Family Medicine

## 2023-12-22 ENCOUNTER — Other Ambulatory Visit: Payer: Self-pay | Admitting: Family Medicine

## 2023-12-22 DIAGNOSIS — Z1231 Encounter for screening mammogram for malignant neoplasm of breast: Secondary | ICD-10-CM

## 2024-01-19 ENCOUNTER — Telehealth: Payer: Self-pay

## 2024-01-19 DIAGNOSIS — I8393 Asymptomatic varicose veins of bilateral lower extremities: Secondary | ICD-10-CM

## 2024-01-19 NOTE — Telephone Encounter (Signed)
Advice:  Pt called asking about where she could get the compression stockings recommended by PA during last office visit.  Pt directed to return to office to be measured and we have them available here.  Pt states she will come today

## 2024-02-03 ENCOUNTER — Ambulatory Visit
Admission: RE | Admit: 2024-02-03 | Discharge: 2024-02-03 | Disposition: A | Discharge status: Home / Self Care | Payer: Medicare Other | Source: Ambulatory Visit | Attending: Family Medicine | Admitting: Family Medicine

## 2024-02-03 DIAGNOSIS — Z1231 Encounter for screening mammogram for malignant neoplasm of breast: Secondary | ICD-10-CM

## 2024-02-03 DIAGNOSIS — I8393 Asymptomatic varicose veins of bilateral lower extremities: Secondary | ICD-10-CM

## 2024-03-23 ENCOUNTER — Encounter: Payer: Self-pay | Admitting: Cardiology

## 2024-03-23 ENCOUNTER — Ambulatory Visit: Payer: Medicare Other | Attending: Internal Medicine | Admitting: Cardiology

## 2024-03-23 VITALS — BP 110/72 | HR 69 | Ht 65.0 in | Wt 178.0 lb

## 2024-03-23 DIAGNOSIS — I10 Essential (primary) hypertension: Secondary | ICD-10-CM

## 2024-03-23 DIAGNOSIS — I25118 Atherosclerotic heart disease of native coronary artery with other forms of angina pectoris: Secondary | ICD-10-CM

## 2024-03-23 NOTE — Patient Instructions (Signed)
 Medication Instructions:  Your physician recommends that you continue on your current medications as directed. Please refer to the Current Medication list given to you today.  *If you need a refill on your cardiac medications before your next appointment, please call your pharmacy*   Lab Work: NONE If you have labs (blood work) drawn today and your tests are completely normal, you will receive your results only by: MyChart Message (if you have MyChart) OR A paper copy in the mail If you have any lab test that is abnormal or we need to change your treatment, we will call you to review the results.   Testing/Procedures: NONE   Follow-Up: At [facility], you and your health needs are our priority.  As part of our continuing mission to provide you with exceptional heart care, we have created designated Provider Care Teams.  These Care Teams include your primary Cardiologist (physician) and Advanced Practice Providers (APPs -  Physician Assistants and Nurse Practitioners) who all work together to provide you with the care you need, when you need it.   Your next appointment:   1 year(s)  Provider:   Donato Schultz, MD    1st Floor: - Lobby - Registration  - Pharmacy  - Lab - Cafe  2nd Floor: - PV Lab - Diagnostic Testing (echo, CT, nuclear med)  3rd Floor: - Vacant  4th Floor: - TCTS (cardiothoracic surgery) - AFib Clinic - Structural Heart Clinic - Vascular Surgery  - Vascular Ultrasound  5th Floor: - HeartCare Cardiology (general and EP) - Clinical Pharmacy for coumadin, hypertension, lipid, weight-loss medications, and med management appointments    Valet parking services will be available as well.

## 2024-03-23 NOTE — Progress Notes (Signed)
 Cardiology Office Note:  .   Date:  03/23/2024  ID:  Janice Mcdonald, DOB 1940/12/28, MRN [id number] PCP: Laurann Montana, MD  [facility] Providers Cardiologist:  Donato Schultz, MD    History of Present Illness: Janice Mcdonald is a 83 y.o. female Discussed the use of AI scribe software for clinical note transcription with the patient, who gave verbal consent to proceed.  History of Present Illness Janice Mcdonald is an 83 year old female with coronary artery disease who presents for follow-up after prior percutaneous intervention.  She underwent a percutaneous intervention in [place] in the summer of 2017 through heart catheterization. She continues to take Plavix for her prior stent placement. Her current medication regimen includes pravastatin 20 mg and Zetia 10 mg for hyperlipidemia, as she is intolerant to Crestor and other statins. She was previously on Repatha. Her blood pressure is well-controlled with carvedilol, which was recently increased to 25 mg twice a day, and valsartan 320 mg daily. She also takes hydrochlorothiazide 12.5 mg daily.  She experiences chronic back pain, which has been alleviated to some extent by visiting a chiropractor, Dr. Mayford Knife. The pain is not as intense as before, although it still occurs intermittently. She uses a brace while doing housework, which provides some relief.  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 She has been trying to maintain physical activity by walking, often in grocery stores or places like [employer], and has adjusted her shopping habits to encourage more frequent outings.       Studies Reviewed: Marland Kitchen   EKG Interpretation Date/Time:  Friday March 23 2024 08:56:31 EDT Ventricular Rate:  69 PR Interval:  168 QRS Duration:  88 QT Interval:  406 QTC Calculation: 435 R Axis:   -59  Text Interpretation: Normal sinus rhythm  Left anterior fascicular block Possible Anterior infarct , age undetermined When compared with ECG of 01-Mar-2022 23:01, No significant change since last tracing Confirmed by Donato Schultz ([id number]) on 03/23/2024 9:01:16 AM    Results DIAGNOSTIC EKG: Normal Risk Assessment/Calculations:            Physical Exam:   VS:  BP 110/72   Pulse 69   Ht 5\' 5"  (1.651 m)   Wt 178 lb (80.7 kg)   LMP  (LMP Unknown)   BMI 29.62 kg/m    Wt Readings from Last 3 Encounters:  03/23/24 178 lb (80.7 kg)  12/09/23 190 lb 6.4 oz (86.4 kg)  04/11/23 210 lb 9.6 oz (95.5 kg)    GEN: Well nourished, well developed in no acute distress NECK: No JVD; No carotid bruits CARDIAC: RRR, no murmurs, no rubs, no gallops RESPIRATORY:  Clear to auscultation without rales, wheezing or rhonchi  ABDOMEN: Soft, non-tender, non-distended EXTREMITIES:  No edema; No deformity   ASSESSMENT AND PLAN: .    Assessment and Plan Assessment & Plan Coronary artery disease with stable angina Coronary artery disease with prior percutaneous intervention in 2017. Currently on pravastatin and Zetia for cholesterol management and Plavix for the prior stent. Heart condition is well-managed with no new issues. Blood pressure is well-controlled, and EKG shows no significant changes. - Continue pravastatin and Zetia for cholesterol management - Continue Plavix for stent maintenance  Hypertension Hypertension is well-controlled with current medications. The carvedilol dosage was recently increased to 25 mg twice a day to enhance blood pressure control. Current blood pressure readings  are excellent, ranging from 90s to 120s. - Continue carvedilol 25 mg twice a day - Continue valsartan 320 mg daily - Continue hydrochlorothiazide 12.5 mg daily  Chronic back pain Chronic back pain is managed with chiropractic care, providing significant relief. She uses a brace during activities that may exacerbate the pain, such as house cleaning. Pain is  not continuous and has decreased in intensity. - Continue chiropractic care with Dr. Mayford Knife - Use back brace during activities that may worsen pain  Hyperlipidemia Hyperlipidemia is managed with pravastatin and Zetia. She is intolerant to Crestor and other statins, but current treatment is effective in maintaining cholesterol levels. LDL goals are less than 70. - Continue pravastatin and Zetia - Monitor cholesterol levels periodically to ensure LDL goals are met          Signed, Donato Schultz, MD

## 2024-04-23 ENCOUNTER — Other Ambulatory Visit: Payer: Self-pay | Admitting: Cardiology

## 2024-09-13 NOTE — Progress Notes (Signed)
 This encounter was created in error - please disregard.

## 2025-01-11 ENCOUNTER — Other Ambulatory Visit: Payer: Self-pay | Admitting: Family Medicine

## 2025-01-11 DIAGNOSIS — Z1231 Encounter for screening mammogram for malignant neoplasm of breast: Secondary | ICD-10-CM

## 2025-02-04 ENCOUNTER — Ambulatory Visit

## 2025-04-09 ENCOUNTER — Ambulatory Visit: Admitting: Cardiology
